# Patient Record
Sex: Female | Born: 1990 | Race: White | Hispanic: No | Marital: Single | Smoking: Former smoker
Health system: Southern US, Community
[De-identification: ages and names within clinical notes are randomized; demographics above are authoritative.]

## PROBLEM LIST (undated history)

## (undated) DIAGNOSIS — F329 Major depressive disorder, single episode, unspecified: Secondary | ICD-10-CM

## (undated) DIAGNOSIS — F32A Depression, unspecified: Secondary | ICD-10-CM

## (undated) DIAGNOSIS — F419 Anxiety disorder, unspecified: Secondary | ICD-10-CM

## (undated) DIAGNOSIS — R011 Cardiac murmur, unspecified: Secondary | ICD-10-CM

## (undated) HISTORY — PX: WISDOM TOOTH EXTRACTION: SHX21

## (undated) HISTORY — DX: Anxiety disorder, unspecified: F41.9

## (undated) HISTORY — PX: KNEE SURGERY: SHX244

---
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
## 2008-02-19 ENCOUNTER — Encounter: Payer: Self-pay | Admitting: Emergency Medicine

## 2008-02-19 ENCOUNTER — Observation Stay (HOSPITAL_COMMUNITY): Admission: AD | Admit: 2008-02-19 | Discharge: 2008-02-19 | Payer: Self-pay | Admitting: Obstetrics & Gynecology

## 2008-03-09 ENCOUNTER — Emergency Department (HOSPITAL_COMMUNITY): Admission: EM | Admit: 2008-03-09 | Discharge: 2008-03-09 | Payer: Self-pay | Admitting: Family Medicine

## 2008-03-15 HISTORY — PX: OTHER SURGICAL HISTORY: SHX169

## 2008-10-31 ENCOUNTER — Inpatient Hospital Stay (HOSPITAL_COMMUNITY): Admission: AD | Admit: 2008-10-31 | Discharge: 2008-11-03 | Payer: Self-pay | Admitting: Obstetrics and Gynecology

## 2010-06-20 LAB — CBC
HCT: 26.4 % — ABNORMAL LOW (ref 36.0–46.0)
HCT: 26.9 % — ABNORMAL LOW (ref 36.0–46.0)
Hemoglobin: 11.1 g/dL — ABNORMAL LOW (ref 12.0–15.0)
Hemoglobin: 8.7 g/dL — ABNORMAL LOW (ref 12.0–15.0)
Hemoglobin: 8.9 g/dL — ABNORMAL LOW (ref 12.0–15.0)
MCHC: 32.8 g/dL (ref 30.0–36.0)
MCHC: 32.8 g/dL (ref 30.0–36.0)
MCV: 84.8 fL (ref 78.0–100.0)
MCV: 85.1 fL (ref 78.0–100.0)
Platelets: 185 10*3/uL (ref 150–400)
Platelets: 203 10*3/uL (ref 150–400)
RBC: 3.97 MIL/uL (ref 3.87–5.11)
RDW: 16.9 % — ABNORMAL HIGH (ref 11.5–15.5)
RDW: 16.9 % — ABNORMAL HIGH (ref 11.5–15.5)
WBC: 18.7 10*3/uL — ABNORMAL HIGH (ref 4.0–10.5)
WBC: 26.8 10*3/uL — ABNORMAL HIGH (ref 4.0–10.5)

## 2010-06-20 LAB — CCBB MATERNAL DONOR DRAW

## 2010-07-28 NOTE — H&P (Signed)
NAMETamala Scott NO.:  [id number]   MEDICAL RECORD NO.:  [id number]          PATIENT TYPE:  MAT   LOCATION:  MATC                          [facility]  [facility]   PHYSICIAN:  Crist Fat. Rivard, M.D. DATE OF BIRTH:  Oct 29, 1990   DATE OF ADMISSION:  10/31/2008  DATE OF DISCHARGE:                              HISTORY & PHYSICAL   HISTORY:  [name] is a 20 year old gravida 1, para 0, at 40-2/7  weeks, who presented complaining of uterine contractions every 5-7  minutes for several hours.  She denied any leaking or bleeding and  reported positive fetal movement.  The cervix had been 3 cm in the  office on 08/17.  Pregnancy has been remarkable for  1. Late care at 15 weeks.  2. Smoker.  3. Previous marijuana use, which the patient stopped in the first      trimester.  4. Group-B strep negative.  5. First trimester bleeding.   PRENATAL LABS:  Blood type is A+, Rh antibody negative.  Urine  nonreactive, rubella titer positive, hepatitis-B surface antigen  negative, HIV was nonreactive.  Hemoglobin upon entering the practice  was 12.4.  The result was not noted on her prenatal record.  Her Glucola  was normal.  Quadruple screen was done and was negative.  Group-B strep  culture and other cultures were done at 36 weeks and were also negative.   HISTORY OF PRESENT PREGNANCY:  The patient entered care at approximately  15 weeks.  She had an ultrasound at that time and had a left corpus  luteum cyst noted.  Posterior placenta was noted and EDC of October 29, 2008, by ultrasound was noted to be in agreement with her LMP dating of  the same.  Smoking cessation was encouraged at that time.  She had some  first trimester bleeding.  This was not evaluated at the time of its  occurrence.  She had reported some marijuana use three times per day, up  to approximate one month prior to her new OB visit, so therefore it was  extending into the first trimester.  She had an  ultrasound done at 28  weeks with an estimated fetal weight within normal limits.  AFI was  normal.  There was a right ovarian cyst noted 9.5 cm x 8.6 cm.  The plan  was made to ultrasound again at 36 weeks and then consider repeating  this postpartum.  She had a UTI noted at 30 weeks and was treated with  Macrobid.  She had group-B strep and other cultures done at 36 weeks.  That is where her prenatal record ends at this time.   OBSTETRICAL HISTORY:  The patient is a primigravida.   MEDICAL HISTORY:  She is a previous marijuana user.  She stopped in  October 2009.  She reports the usual childhood illnesses.  The patient  used to have a heart murmur, but has had no problems since that time.   SURGICAL HISTORY:  1. Knee surgery at age 63.  2. Wisdom teeth removed at age 40.   FAMILY HISTORY:  Maternal  grandmother and maternal aunt x2 had chronic  hypertension.  Her maternal grandmother was diabetic, on insulin.  Her  maternal aunt had a stroke.  Paternal uncle also had a stroke.  Maternal  grandmother had lymphoma.  Maternal aunt and maternal cousin are  bipolar.  Everyone in the family smokes, per the patient's report.   GENETIC HISTORY:  Is unremarkable.   SOCIAL HISTORY:  The patient is single.  The father of the baby is  involved and supportive.  His name is Charlene Scott buyer.  The patient is  Caucasian.  She denies religious affiliation.  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  Her partner has an 11th grade  education.  He is employed as a Conservation officer, nature and is also working as a Nurse, children's-  Field seismologist.  The patient does report marijuana use in the early  first trimester.  She is a five to six-cigarette-per-day smoker.   ALLERGIES:  The patient has a sensitivity as a child to CODEINE.  No  other medication allergies are reported.   PHYSICAL EXAMINATION:  VITAL SIGNS:  Initial blood pressures were 125/90  and 127/88.  Follow-up blood pressures were more resolved in  the 120/70  range.  Other vital signs are stable.  HEENT: Within normal limits.  LUNGS:  Bilateral breath sounds are clear.  HEART:  Regular rate and rhythm, without murmur.  BREASTS:  Soft and nontender.  ABDOMEN:  Fundal height is appropriate for gestational age.  PELVIC EXAM:  Uterine contractions initially were every 5-7 minutes and  mild.  The patient did walk for an hour and then she was noted to have  contractions every 4 minutes, of more moderate quality.  Cervical exam  initially was 3 to 4, 100% vertex at minus one station by the R.N. exam  on the patient's arrival.  The cervix now is 5 to 6,  100% vertex at  minus one station, with bulging bag of water and positive bloody show.  Fetal heart rate was reactive on admission and remained so.  EXTREMITIES:  Within normal limits.   IMPRESSION:  1. Intrauterine pregnancy at 40-2/7 weeks.  2. Active labor.  3. Negative group-B Streptococcus.   PLAN:  1. Admit to birthing suite per consult with Dr. Silverio Lay, as      attending physician.  2. Routine physician orders.  3. The patient may desire IV pain medication or dural.  4. The MD's will follow.      Renaldo Reel Emilee Hero, C.N.M.      Crist Fat Rivard, M.D.  Electronically Signed    VLL/MEDQ  D:  10/31/2008  T:  10/31/2008  Job:  [id number]

## 2010-07-28 NOTE — Discharge Summary (Signed)
NAMEAudree Scott             ACCOUNT NO.:  [id number]   MEDICAL RECORD NO.:  [id number]          PATIENT TYPE:  INP   LOCATION:  9308                          [facility]  [facility]   PHYSICIAN:  Osborn Coho, M.D.   DATE OF BIRTH:  Feb 02, 1991   DATE OF ADMISSION:  10/31/2008  DATE OF DISCHARGE:  11/03/2008                               DISCHARGE SUMMARY   ADMITTING DIAGNOSES:  1. Intrauterine pregnancy at 40 and 2/7 weeks.  2. Active labor.  3. GBS negative.   DISCHARGE DIAGNOSES:  1. Intrauterine pregnancy at 40 and 2/7 weeks.  2. Active labor.  3. GBS negative.  4. Bottle feeding.  5. Anemia without hemodynamic instability.  6. Status post vacuum extraction with findings of a viable female      infant born on November 01, 2010 at 0214 at 40 and 3/7 weeks, left      occipitoanterior position and female weight 8 pounds 1 ounce (3665      g), length 21 inches, and Apgars 7 at 1 minute and 9 at 5 minutes.   HOSPITAL PROCEDURES:  1. Epidural anesthesia.  2. Vacuum-assisted vaginal delivery secondary to maternal exhaustion.   HOSPITAL COURSE:  [name] is a 20 year old gravida 1, para 0, who  presented on date of admission at 40-2/7 weeks, complaining of uterine  contractions every 5-7 minutes for several hours, denied any leakage of  fluid or vaginal bleeding, but a positive fetal movement.  Cervix had  been 3 cm in the office on August 17.  Pregnancy had been remarkable  for:  1. Late to care at 15 weeks.  2. Smoker.  3. Previous marijuana use.  The patient stopped after first trimester.  4. GBS negative.  5. First trimester vaginal bleeding.   On presentation, she was afebrile.  Initial blood pressure was 125/93,  repeat was 127/88, and all blood pressures, following that were within  normal limits.  Her contractions were initially every 5-7 minutes and  were mild.  After walking for 1 hour, contractions were 4 minutes and  moderate quality.  Initial cervical exam was  3-4 cm, 100% effaced, and  vertex -1 station.  Following ambulation, cervix was 5-6 cm, 100% and -1  with bulging membranes and positive bloody show.  Fetal heart rate was  reactive.  She was admitted to birthing suite per consult with Dr.  Dois Davenport Rivard with routine L and D orders.  Around 2:30 in the afternoon  on the 19th, the patient was resting and doing better after having IV  Stadol.  Fetal heart rate was reassuring.  Contractions were still  around every 4 minutes.  Vaginal exam was unchanged 5-6 cm, 100% and -1.  Artificial rupture of membranes was performed by Dr. Dois Davenport Rivard and  clear fluid was noted.  Dr. Estanislado Pandy did talk to the patient about getting  epidural at that time, at 1730 p.m., she returned, the patient did  receive epidural, and was comfortable.  Fetal heart rate was overall  reassuring.  Vaginal exam was still 5 cm, 100% with some edema to  anterior lip, ROP  presentation, an IUPC was inserted, and plan was made  to begin Pitocin if induce inadequate.  At 23:30 on the 19th, Pitocin  was at 14 milliunits per minute.  Contractions were every 1-1/2 to 2  minutes and these were greater than equal to 200.  Fetal heart rate was  reactive, excellent variability and accelerations.  She had an anterior  lip on her vaginal exam, ROA position was noted, +1 to +2 station.  The  patient was fully dilated at around 12:30 a.m. on the 20th and was  pushing with her contractions and pushing well.  Fetal heart rate was  reassuring.  After a little over 2 hours of pushing, the patient had  pushed to crowning and Dr. Estanislado Pandy did apply outlet vacuum due to  maternal exhaustion.  It was applied easily, crowning in ROA position,  traction over to contractions, and a vacuum-assisted vaginal delivery  was noted at 2:14 a.m. and findings were a viable female infant named  Charlene Scott, she weighed 8 pounds 1 ounce, Apgars were 7 at 1 minute and 9  at 5 minutes.  Second-degree laceration was  noted and repaired with a 3-  0 and 4-0 Monopril, spontaneously expelled placenta with three-vessel  cord, and was given for cord blood donation.  The patient did receive 3  g of IV and Unasyn 30 minutes before delivery for temperature of 100.7  with some fetal tachycardia in 170-180s.  EBL was approximately 500 mL.  She did require 1000 of Cytotec per rectum and tolerated well and was  stable on L and D recovery.  She was transferred to the third floor for  her postpartum stay.  On postpartum day #1, which is a 21st, she was  doing well.  She was ambulating, voiding and tolerating p.o. liquids and  solids without difficulty.  Denied any dizziness or weakness.  She was  bottle feeding and pain was well controlled.  She remained afebrile and  vital signs were stable.  Blood pressure range from 95-112 systolic and  diastolics were 16-10.  Her physical exam was within normal limits.  Abdomen was soft and nontender.  Positive bowel sounds.  Fundus was firm  and 2 fingerbreadths below umbilicus.  She had small rubra lochia and  perineum was healing.  No lower extremity edema and negative Homans.  CBC, white count was up to 18.7 from 17.7, hemoglobin was down to 8.7  from 11.1 and her platelets were stable at 203.  Routine postpartum care  continued and on November 03, 2008, postpartum day #2, she was doing well  and was ready for discharge.  Continued to deny any dizziness or  syncope.  She is undecided on contraception.  She is still bottle  feeding.  She reports being sore mainly with ambulation and sitting.  As  far as perineal pain, she did desire additional pain medication with a  Motrin.  She is voiding and up without difficulty and tolerating diet.  She remains afebrile and vital signs stable.  Physical exam is within  normal limits.  Fundus is firm, below umbilicus.  She had small rubra  lochia.  Perineum is healing and extremities are still without edema and  negative Homans.    DISCHARGE INSTRUCTIONS:  Per [facility] pamphlet.  Warning signs and symptoms  report were reviewed.   DISCHARGE FOLLOWUP:  To occur at [facility] and 4-6 weeks  or as needed.   DISCHARGE MEDICATIONS:  1. Motrin 600 mg p.o. q.6  h. p.r.n. pain.  2. Vicodin 1 tablet p.o. q.3 h. p.r.n. moderate-to-severe pain or 2      tablets p.o. q.6 h. p.r.n. moderate-to-severe pain.  3. Prefera-OB.  4. Prenatal vitamin 1 tablet p.o. day.  5. Did offer the patient Depo-Provera shot today before discharge, but      the patient declines and is to decide at her postpartum appointment      regarding future contraception.     Candice Lucas, [place]      Osborn Coho, M.D.  Electronically Signed   CHS/MEDQ  D:  11/03/2008  T:  11/03/2008  Job:  [id number]

## 2010-12-10 LAB — POCT URINALYSIS DIP (DEVICE)
Bilirubin Urine: NEGATIVE
Glucose, UA: NEGATIVE
Ketones, ur: NEGATIVE
Specific Gravity, Urine: 1.02

## 2010-12-10 LAB — URINE CULTURE
Colony Count: NO GROWTH
Culture: NO GROWTH

## 2010-12-11 LAB — GC/CHLAMYDIA PROBE AMP, GENITAL
Chlamydia, DNA Probe: NEGATIVE
GC Probe Amp, Genital: NEGATIVE

## 2010-12-11 LAB — POCT URINALYSIS DIP (DEVICE)
Nitrite: NEGATIVE
Urobilinogen, UA: 1
pH: 7.5

## 2010-12-11 LAB — URINE CULTURE: Colony Count: 9000

## 2010-12-11 LAB — POCT PREGNANCY, URINE: Preg Test, Ur: NEGATIVE

## 2010-12-18 LAB — URINE MICROSCOPIC-ADD ON

## 2010-12-18 LAB — WET PREP, GENITAL
Trich, Wet Prep: NONE SEEN
Yeast Wet Prep HPF POC: NONE SEEN

## 2010-12-18 LAB — POCT URINALYSIS DIP (DEVICE)
Hgb urine dipstick: NEGATIVE
Nitrite: NEGATIVE
Specific Gravity, Urine: 1.015 (ref 1.005–1.030)
pH: 7 (ref 5.0–8.0)

## 2010-12-18 LAB — CBC
Hemoglobin: 12.6 g/dL (ref 12.0–15.0)
MCHC: 33.1 g/dL (ref 30.0–36.0)
MCHC: 35.1 g/dL (ref 30.0–36.0)
MCV: 93.3 fL (ref 78.0–100.0)
Platelets: 190 10*3/uL (ref 150–400)
RBC: 3.16 MIL/uL — ABNORMAL LOW (ref 3.87–5.11)
RDW: 14.1 % (ref 11.5–15.5)
RDW: 14.4 % (ref 11.5–15.5)

## 2010-12-18 LAB — URINALYSIS, ROUTINE W REFLEX MICROSCOPIC
Glucose, UA: NEGATIVE mg/dL
Hgb urine dipstick: NEGATIVE
Protein, ur: NEGATIVE mg/dL
pH: 6 (ref 5.0–8.0)

## 2010-12-18 LAB — DIFFERENTIAL
Basophils Absolute: 0 10*3/uL (ref 0.0–0.1)
Basophils Absolute: 0 10*3/uL (ref 0.0–0.1)
Basophils Relative: 0 % (ref 0–1)
Basophils Relative: 0 % (ref 0–1)
Eosinophils Absolute: 0.2 10*3/uL (ref 0.0–0.7)
Lymphocytes Relative: 12 % (ref 12–46)
Lymphocytes Relative: 28 % (ref 12–46)
Monocytes Absolute: 0.8 10*3/uL (ref 0.1–1.0)
Neutro Abs: 11.6 10*3/uL — ABNORMAL HIGH (ref 1.7–7.7)
Neutro Abs: 4.6 10*3/uL (ref 1.7–7.7)
Neutrophils Relative %: 60 % (ref 43–77)
Neutrophils Relative %: 83 % — ABNORMAL HIGH (ref 43–77)

## 2010-12-18 LAB — GC/CHLAMYDIA PROBE AMP, GENITAL: GC Probe Amp, Genital: NEGATIVE

## 2010-12-18 LAB — POCT I-STAT, CHEM 8
BUN: 20 mg/dL (ref 6–23)
Calcium, Ion: 1.18 mmol/L (ref 1.12–1.32)
Chloride: 108 mEq/L (ref 96–112)
HCT: 39 % (ref 36.0–46.0)
Potassium: 3.7 mEq/L (ref 3.5–5.1)
Sodium: 140 mEq/L (ref 135–145)

## 2011-04-21 ENCOUNTER — Encounter (HOSPITAL_COMMUNITY): Payer: Self-pay | Admitting: Pharmacist

## 2011-04-21 ENCOUNTER — Other Ambulatory Visit: Payer: Self-pay | Admitting: Obstetrics and Gynecology

## 2011-04-24 NOTE — H&P (Signed)
NAMETamala Scott NO.:  [id number]  MEDICAL RECORD NO.:  [id number]  LOCATION:  PERIO                         [facility]  [facility]  PHYSICIAN:  Crist Fat. Rivard, M.D. DATE OF BIRTH:  02/25/1991  DATE OF ADMISSION:  04/20/2011 DATE OF DISCHARGE:                             HISTORY & PHYSICAL   HISTORY OF PRESENT ILLNESS:  Charlene Scott is a 21 year old single white female, para 1-0-0-1, presenting for a robot-assisted laparoscopic ovarian cystectomy, because of a persistent enlarging left ovarian cyst. In August 2010, the patient had a vacuum-assisted vaginal birth and was Noted, during her antepartum period, to have a 9-cm right "corpus luteum" ovarian cyst.  Following the patient's delivery, a followup ultrasound noted that the cyst was actually on the left, was simple, and measured at that time 8.45 cm x 4.98 cm x 7.50 cm.  The patient was essentially asymptomatic except for occasional pelvic pressure and positional dyspareunia.  In January 2013, however, Charlene Scott complained of increasing pelvic pressure with pain and worsening dyspareunia.  She denied any vaginitis symptoms, urinary tract symptoms, changes in her bowel habits or fever.  The patient had a repeat pelvic ultrasound in February 2013 that showed a complex left ovarian cyst, measuring 9.87 x 9.56 x 7.4 cm without any increased color Doppler flow.  The patient's intrauterine device was properly seated within the endometrium by 3-D rendering, her right ovary measured 2.88 x 3.33 x 2.30 cm, and her uterus measured 6.95 x 3.76 x 3.47 cm.  The patient had normal labs that included a comprehensive metabolic panel, complete blood count, LBH, and OVA-1 and a negative beta-HCG.  Given that patient's cyst has been persistent, enlarging and becoming increasingly symptomatic, she has decided to proceed with laparoscopic removal of her left ovarian cyst.  PAST MEDICAL HISTORY:  OBSTETRICAL HISTORY:  Gravida 1,  para 1-0-0-1.  The patient had a vacuum- assisted vaginal birth in August 2010.  GYNECOLOGICAL HISTORY:  Menarche, 21 years old.  The patient does not have a period due to the fact she uses Mirena intrauterine device as her method of contraception.  She denies any history of abnormal Pap smears or sexually transmitted diseases.  Her last Pap was in January 2013.  MEDICAL HISTORY:  Negative.  SURGICAL HISTORY:  2006, right knee surgery.  FAMILY HISTORY:  Positive for asthma, leukemia, Hodgkin lymphoma, hypertension, blood clots, anemia, diabetes mellitus, migraines, stroke, bipolar disorder, depression, and thyroid disease.  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  HABITS:  She uses tobacco daily.  Denies any alcohol or illicit drug use.  CURRENT MEDICATIONS:  None.  ALLERGIES:  She is allergic to CODEINE that causes a rash.  REVIEW OF SYSTEMS:  The patient denies any headache, vision changes, chest pain, shortness of breath, dysuria, hematuria, urinary frequency, urgency, nausea, vomiting, diarrhea, myalgias, arthralgias, and except as is mentioned in history of present illness, the patient's review of systems is otherwise negative.  PHYSICAL EXAMINATION:  VITAL SIGNS:  Blood pressure 104/72, temperature is 98.2 degrees Fahrenheit orally, weight is 171 pounds, height is 5 feet 6.5 inches tall.  Body  mass index is 27.2. HEENT:  Pupils are equal.  Hearing is normal.  Throat is clear. NECK:  Supple without masses.  There is no thyromegaly or cervical adenopathy. HEART:  Regular rate and rhythm. LUNGS:  Clear. BACK:  No CVA tenderness. ABDOMEN:  No tenderness, guarding, rebound, palpable masses, or organomegaly. EXTREMITIES:  No clubbing, cyanosis, or edema. PELVIC:  EGBUS was normal.  Vagina was normal.  Cervix was nontender without lesions.  There were visible IUD strings coming from the cervical  os.  Uterus appeared normal size, shape, and consistency, however, there was a palpable tender mass on the posterior aspect of the uterus.  Adnexa, no palpable masses, however, there was tenderness in the left adnexa.  IMPRESSION: 1. Large left ovarian cyst. 2. Pelvic pain. 3. Dyspareunia.  DISPOSITION:  A discussion was held with the patient regarding indications for her procedure along with its risks, which include, but are not limited to, reaction to anesthesia, damage to adjacent organs, infection, and excessive bleeding.  The patient verbalized understanding of these risks along with the possibility that an oophorectomy may be required and has consented to proceed with a robot-assisted laparoscopic left ovarian cystectomy with pelvic washings and frozen section at [facility] on April 28, 2011 at 2:00 p.m.     Vitali Seibert J. Lowell Guitar, P.A.-C   ______________________________ Crist Fat Rivard, M.D.    EJP/MEDQ  D:  04/23/2011  T:  04/24/2011  Job:  [id number]

## 2011-04-27 ENCOUNTER — Encounter (HOSPITAL_COMMUNITY)
Admission: RE | Admit: 2011-04-27 | Discharge: 2011-04-27 | Disposition: A | Discharge status: Home / Self Care | Payer: BC Managed Care – PPO | Source: Ambulatory Visit | Attending: Obstetrics and Gynecology | Admitting: Obstetrics and Gynecology

## 2011-04-27 ENCOUNTER — Encounter (HOSPITAL_COMMUNITY): Payer: Self-pay

## 2011-04-27 HISTORY — DX: Cardiac murmur, unspecified: R01.1

## 2011-04-27 HISTORY — DX: Depression, unspecified: F32.A

## 2011-04-27 HISTORY — DX: Major depressive disorder, single episode, unspecified: F32.9

## 2011-04-27 NOTE — Pre-Procedure Instructions (Signed)
Ok to see patient DOS. 

## 2011-04-27 NOTE — Patient Instructions (Addendum)
   Your procedure is scheduled on: Wednesday, Feb 13th  Enter through the [facility] at: 12:45pm Pick up the phone at the desk and dial [phone] and inform us of your arrival.  Please call this number if you have any problems the morning of surgery: [phone]  Remember: Do not eat food after midnight: Tuesday Do not drink clear liquids after: 10am Wednesday Take these medicines the morning of surgery with a SIP OF WATER: none  Do not wear jewelry, make-up, or FINGER nail polish Do not wear lotions, powders, perfumes or deodorant. Do not shave 48 hours prior to surgery. Do not bring valuables to the hospital.  Patients discharged on the day of surgery will not be allowed to drive home.   Home with Parents Mr and Ms. Germann  cell [phone].   Remember to use your hibiclens as instructed.Please shower with 1/2 bottle the evening before your surgery and the other 1/2 bottle the morning of surgery.

## 2011-04-28 ENCOUNTER — Ambulatory Visit (HOSPITAL_COMMUNITY)
Admission: RE | Admit: 2011-04-28 | Discharge: 2011-04-28 | Disposition: A | Discharge status: Home / Self Care | Payer: BC Managed Care – PPO | Source: Ambulatory Visit | Attending: Obstetrics and Gynecology | Admitting: Obstetrics and Gynecology

## 2011-04-28 ENCOUNTER — Encounter (HOSPITAL_COMMUNITY): Payer: Self-pay | Admitting: *Deleted

## 2011-04-28 ENCOUNTER — Encounter (HOSPITAL_COMMUNITY): Payer: Self-pay | Admitting: Certified Registered"

## 2011-04-28 ENCOUNTER — Other Ambulatory Visit: Payer: Self-pay | Admitting: Obstetrics and Gynecology

## 2011-04-28 ENCOUNTER — Encounter (HOSPITAL_COMMUNITY)
Admission: RE | Disposition: A | Discharge status: Home / Self Care | Payer: Self-pay | Source: Ambulatory Visit | Attending: Obstetrics and Gynecology

## 2011-04-28 ENCOUNTER — Ambulatory Visit (HOSPITAL_COMMUNITY): Payer: BC Managed Care – PPO | Admitting: Certified Registered"

## 2011-04-28 DIAGNOSIS — D279 Benign neoplasm of unspecified ovary: Secondary | ICD-10-CM | POA: Insufficient documentation

## 2011-04-28 DIAGNOSIS — Z975 Presence of (intrauterine) contraceptive device: Secondary | ICD-10-CM | POA: Insufficient documentation

## 2011-04-28 DIAGNOSIS — N83209 Unspecified ovarian cyst, unspecified side: Secondary | ICD-10-CM

## 2011-04-28 LAB — PREGNANCY, URINE: Preg Test, Ur: NEGATIVE

## 2011-04-28 SURGERY — ROBOTIC ASSISTED LAPAROSCOPIC OVARIAN CYSTECTOMY
Anesthesia: General | Site: Abdomen | Laterality: Left | Wound class: Clean Contaminated

## 2011-04-28 MED ORDER — ROCURONIUM BROMIDE 50 MG/5ML IV SOLN
INTRAVENOUS | Status: AC
  Filled 2011-04-28: qty 1

## 2011-04-28 MED ORDER — PROMETHAZINE HCL 25 MG/ML IJ SOLN: 6.2500 mg | INTRAMUSCULAR | Status: DC | PRN

## 2011-04-28 MED ORDER — BUPIVACAINE HCL (PF) 0.25 % IJ SOLN
INTRAMUSCULAR | Status: DC | PRN
  Administered 2011-04-28: 10 mL

## 2011-04-28 MED ORDER — CEFAZOLIN SODIUM 1-5 GM-% IV SOLN
1.0000 g | INTRAVENOUS | Status: AC
  Administered 2011-04-28: 1 g via INTRAVENOUS

## 2011-04-28 MED ORDER — NEOSTIGMINE METHYLSULFATE 1 MG/ML IJ SOLN
INTRAMUSCULAR | Status: DC | PRN
  Administered 2011-04-28: 3 mg via INTRAVENOUS

## 2011-04-28 MED ORDER — ARTIFICIAL TEARS OP OINT
TOPICAL_OINTMENT | OPHTHALMIC | Status: DC | PRN
  Administered 2011-04-28: 1 via OPHTHALMIC

## 2011-04-28 MED ORDER — HYDROCODONE-ACETAMINOPHEN 5-325 MG PO TABS
1.0000 | ORAL_TABLET | Freq: Once | ORAL | Status: AC
  Administered 2011-04-28: 1 via ORAL

## 2011-04-28 MED ORDER — HYDROCODONE-ACETAMINOPHEN 5-325 MG PO TABS
ORAL_TABLET | ORAL | Status: AC
  Administered 2011-04-28: 1 via ORAL
  Filled 2011-04-28: qty 1

## 2011-04-28 MED ORDER — CEFAZOLIN SODIUM 1-5 GM-% IV SOLN
INTRAVENOUS | Status: AC
  Filled 2011-04-28: qty 50

## 2011-04-28 MED ORDER — BUPIVACAINE HCL (PF) 0.25 % IJ SOLN
INTRAMUSCULAR | Status: AC
  Filled 2011-04-28: qty 30

## 2011-04-28 MED ORDER — GLYCOPYRROLATE 0.2 MG/ML IJ SOLN
INTRAMUSCULAR | Status: AC
Start: 2011-04-28 — End: 2011-04-28
  Filled 2011-04-28: qty 2

## 2011-04-28 MED ORDER — IBUPROFEN 600 MG PO TABS: 600.0000 mg | ORAL_TABLET | Freq: Four times a day (QID) | ORAL | Status: AC | PRN

## 2011-04-28 MED ORDER — LACTATED RINGERS IR SOLN
Status: DC | PRN
  Administered 2011-04-28: 3000 mL

## 2011-04-28 MED ORDER — PROPOFOL 10 MG/ML IV EMUL
INTRAVENOUS | Status: AC
  Filled 2011-04-28: qty 40

## 2011-04-28 MED ORDER — MIDAZOLAM HCL 2 MG/2ML IJ SOLN
INTRAMUSCULAR | Status: AC
  Filled 2011-04-28: qty 2

## 2011-04-28 MED ORDER — ONDANSETRON HCL 4 MG/2ML IJ SOLN
INTRAMUSCULAR | Status: DC | PRN
  Administered 2011-04-28: 4 mg via INTRAVENOUS

## 2011-04-28 MED ORDER — ONDANSETRON HCL 4 MG/2ML IJ SOLN
INTRAMUSCULAR | Status: AC
  Filled 2011-04-28: qty 2

## 2011-04-28 MED ORDER — SILVER NITRATE-POT NITRATE 75-25 % EX MISC
CUTANEOUS | Status: AC
  Filled 2011-04-28: qty 1

## 2011-04-28 MED ORDER — SILVER NITRATE-POT NITRATE 75-25 % EX MISC
CUTANEOUS | Status: DC | PRN
  Administered 2011-04-28: 1

## 2011-04-28 MED ORDER — SUFENTANIL CITRATE 50 MCG/ML IV SOLN
INTRAVENOUS | Status: AC
  Filled 2011-04-28: qty 1

## 2011-04-28 MED ORDER — NEOSTIGMINE METHYLSULFATE 1 MG/ML IJ SOLN
INTRAMUSCULAR | Status: AC
  Filled 2011-04-28: qty 10

## 2011-04-28 MED ORDER — PROPOFOL 10 MG/ML IV EMUL
INTRAVENOUS | Status: DC | PRN
  Administered 2011-04-28: 20 mg via INTRAVENOUS
  Administered 2011-04-28: 160 mg via INTRAVENOUS

## 2011-04-28 MED ORDER — SUFENTANIL CITRATE 50 MCG/ML IV SOLN
INTRAVENOUS | Status: DC | PRN
  Administered 2011-04-28: 20 ug via INTRAVENOUS
  Administered 2011-04-28: 10 ug via INTRAVENOUS
  Administered 2011-04-28: 20 ug via INTRAVENOUS

## 2011-04-28 MED ORDER — MEPERIDINE HCL 25 MG/ML IJ SOLN: 6.2500 mg | INTRAMUSCULAR | Status: DC | PRN

## 2011-04-28 MED ORDER — LIDOCAINE HCL (CARDIAC) 20 MG/ML IV SOLN
INTRAVENOUS | Status: AC
Start: 2011-04-28 — End: 2011-04-28
  Filled 2011-04-28: qty 5

## 2011-04-28 MED ORDER — FENTANYL CITRATE 0.05 MG/ML IJ SOLN: 25.0000 ug | INTRAMUSCULAR | Status: DC | PRN

## 2011-04-28 MED ORDER — GLYCOPYRROLATE 0.2 MG/ML IJ SOLN
INTRAMUSCULAR | Status: DC | PRN
  Administered 2011-04-28: .6 mg via INTRAVENOUS

## 2011-04-28 MED ORDER — LACTATED RINGERS IV SOLN
INTRAVENOUS | Status: DC
  Administered 2011-04-28 (×3): via INTRAVENOUS
  Administered 2011-04-28: 50 mL/h via INTRAVENOUS

## 2011-04-28 MED ORDER — MIDAZOLAM HCL 5 MG/5ML IJ SOLN
INTRAMUSCULAR | Status: DC | PRN
  Administered 2011-04-28: 2 mg via INTRAVENOUS

## 2011-04-28 MED ORDER — ROCURONIUM BROMIDE 100 MG/10ML IV SOLN
INTRAVENOUS | Status: DC | PRN
  Administered 2011-04-28: 20 mg via INTRAVENOUS
  Administered 2011-04-28: 50 mg via INTRAVENOUS

## 2011-04-28 MED ORDER — HYDROCODONE-ACETAMINOPHEN 5-500 MG PO TABS: 1.0000 | ORAL_TABLET | Freq: Four times a day (QID) | ORAL | Status: AC | PRN

## 2011-04-28 MED ORDER — LIDOCAINE HCL (CARDIAC) 20 MG/ML IV SOLN
INTRAVENOUS | Status: DC | PRN
  Administered 2011-04-28: 50 mg via INTRAVENOUS

## 2011-04-28 MED ORDER — DEXAMETHASONE SODIUM PHOSPHATE 10 MG/ML IJ SOLN
INTRAMUSCULAR | Status: AC
  Filled 2011-04-28: qty 1

## 2011-04-28 MED ORDER — KETOROLAC TROMETHAMINE 30 MG/ML IJ SOLN: 15.0000 mg | Freq: Once | INTRAMUSCULAR | Status: DC | PRN

## 2011-04-28 MED ORDER — DEXAMETHASONE SODIUM PHOSPHATE 10 MG/ML IJ SOLN
INTRAMUSCULAR | Status: DC | PRN
  Administered 2011-04-28: 10 mg via INTRAVENOUS

## 2011-04-28 SURGICAL SUPPLY — 69 items
10181 ×2 IMPLANT
BAG URINE DRAINAGE (UROLOGICAL SUPPLIES) ×2 IMPLANT
BARRIER ADHS 3X4 INTERCEED (GAUZE/BANDAGES/DRESSINGS) ×2 IMPLANT
BENZOIN TINCTURE PRP APPL 2/3 (GAUZE/BANDAGES/DRESSINGS) ×2 IMPLANT
CABLE HIGH FREQUENCY MONO STRZ (ELECTRODE) ×2 IMPLANT
CATH FOLEY 3WAY  5CC 16FR (CATHETERS) ×1
CATH FOLEY 3WAY  5CC 18FR (CATHETERS)
CATH FOLEY 3WAY 5CC 16FR (CATHETERS) ×1 IMPLANT
CATH FOLEY 3WAY 5CC 18FR (CATHETERS) IMPLANT
CHLORAPREP W/TINT 26ML (MISCELLANEOUS) ×2 IMPLANT
CLOTH BEACON ORANGE TIMEOUT ST (SAFETY) ×2 IMPLANT
CONT PATH 16OZ SNAP LID 3702 (MISCELLANEOUS) ×2 IMPLANT
CORDS BIPOLAR (ELECTRODE) IMPLANT
COVER MAYO STAND STRL (DRAPES) ×2 IMPLANT
COVER TABLE BACK 60X90 (DRAPES) ×4 IMPLANT
COVER TIP SHEARS 8 DVNC (MISCELLANEOUS) ×1 IMPLANT
COVER TIP SHEARS 8MM DA VINCI (MISCELLANEOUS) ×1
DECANTER SPIKE VIAL GLASS SM (MISCELLANEOUS) ×2 IMPLANT
DERMABOND ADVANCED (GAUZE/BANDAGES/DRESSINGS) ×1
DERMABOND ADVANCED .7 DNX12 (GAUZE/BANDAGES/DRESSINGS) ×1 IMPLANT
DRAPE HUG U DISPOSABLE (DRAPE) ×2 IMPLANT
DRAPE LG THREE QUARTER DISP (DRAPES) ×4 IMPLANT
DRAPE MONITOR DA VINCI (DRAPE) IMPLANT
DRAPE WARM FLUID 44X44 (DRAPE) ×2 IMPLANT
ELECT REM PT RETURN 9FT ADLT (ELECTROSURGICAL) ×2
ELECTRODE REM PT RTRN 9FT ADLT (ELECTROSURGICAL) ×1 IMPLANT
EVACUATOR SMOKE 8.L (FILTER) ×2 IMPLANT
GAUZE VASELINE 3X9 (GAUZE/BANDAGES/DRESSINGS) IMPLANT
GLOVE BIOGEL PI IND STRL 7.0 (GLOVE) ×3 IMPLANT
GLOVE BIOGEL PI INDICATOR 7.0 (GLOVE) ×3
GLOVE ECLIPSE 6.5 STRL STRAW (GLOVE) ×6 IMPLANT
GOWN STRL REIN XL XLG (GOWN DISPOSABLE) ×12 IMPLANT
GRASPER BIPOLAR FEN DA VINCI (INSTRUMENTS)
GRASPER BPLR FEN DVNC (INSTRUMENTS) IMPLANT
KIT ACCESSORY DA VINCI DISP (KITS) ×1
KIT ACCESSORY DVNC DISP (KITS) ×1 IMPLANT
KIT DISP ACCESSORY 4 ARM (KITS) IMPLANT
NS IRRIG 1000ML POUR BTL (IV SOLUTION) ×6 IMPLANT
OCCLUDER COLPOPNEUMO (BALLOONS) IMPLANT
PACK LAVH (CUSTOM PROCEDURE TRAY) ×2 IMPLANT
PAD PREP 24X48 CUFFED NSTRL (MISCELLANEOUS) ×4 IMPLANT
PLUG CATH AND CAP STER (CATHETERS) ×2 IMPLANT
POSITIONER SURGICAL ARM (MISCELLANEOUS) ×4 IMPLANT
PROTECTOR NERVE ULNAR (MISCELLANEOUS) ×4 IMPLANT
SET CYSTO W/LG BORE CLAMP LF (SET/KITS/TRAYS/PACK) IMPLANT
SET IRRIG TUBING LAPAROSCOPIC (IRRIGATION / IRRIGATOR) ×2 IMPLANT
SOLUTION ELECTROLUBE (MISCELLANEOUS) ×2 IMPLANT
SPONGE LAP 18X18 X RAY DECT (DISPOSABLE) IMPLANT
STRIP CLOSURE SKIN 1/2X4 (GAUZE/BANDAGES/DRESSINGS) ×2 IMPLANT
SUT MNCRL AB 3-0 PS2 27 (SUTURE) ×4 IMPLANT
SUT VIC AB 0 CT1 27 (SUTURE)
SUT VIC AB 0 CT1 27XBRD ANTBC (SUTURE) IMPLANT
SUT VIC AB 0 CT2 27 (SUTURE) IMPLANT
SUT VIC AB 2-0 CT2 27 (SUTURE) ×2 IMPLANT
SUT VICRYL 0 UR6 27IN ABS (SUTURE) ×4 IMPLANT
SYR 50ML LL SCALE MARK (SYRINGE) ×2 IMPLANT
SYSTEM CONVERTIBLE TROCAR (TROCAR) ×2 IMPLANT
TIP UTERINE 5.1X6CM LAV DISP (MISCELLANEOUS) IMPLANT
TIP UTERINE 6.7X10CM GRN DISP (MISCELLANEOUS) IMPLANT
TIP UTERINE 6.7X6CM WHT DISP (MISCELLANEOUS) IMPLANT
TIP UTERINE 6.7X8CM BLUE DISP (MISCELLANEOUS) ×2 IMPLANT
TOWEL OR 17X24 6PK STRL BLUE (TOWEL DISPOSABLE) ×6 IMPLANT
TROCAR 12M 150ML BLUNT (TROCAR) ×2 IMPLANT
TROCAR DISP BLADELESS 8 DVNC (TROCAR) ×1 IMPLANT
TROCAR DISP BLADELESS 8MM (TROCAR) ×1
TROCAR XCEL 12X100 BLDLESS (ENDOMECHANICALS) ×2 IMPLANT
TROCAR Z-THREAD BLADED 12X100M (TROCAR) IMPLANT
TUBING FILTER THERMOFLATOR (ELECTROSURGICAL) ×2 IMPLANT
WARMER LAPAROSCOPE (MISCELLANEOUS) ×2 IMPLANT

## 2011-04-28 NOTE — Discharge Instructions (Signed)
POST-OPERATIVE INSTRUCTIONS TO PATIENT  Call [facility]  [phone])  for excessive pain, bleeding or temperature greater than or equal to 100.4 degrees (orally).    No driving for 1day No lifting (more than 20 lbs) for 1 week No sexual activity for 2 weeks  Pain management:  Use Ibuprofen 600 mg every 6 hours for 5 days and then as needed. Use your pain medication as needed to maintain a pain level at or below 3/10 Use Colace 1-2 capsules per day as long as you are using pain  medication to avoid constipation.       Diet: normal  Bathing: may shower day after surgery  Wound Care: keep incisions clean and dry  Return to Dr. Estanislado Pandy on 05/19/11 at 8:30 am  Return to work: 1 week    Domonick Sittner A MD 04/28/2011 2:24 PM

## 2011-04-28 NOTE — Transfer of Care (Signed)
Immediate Anesthesia Transfer of Care Note  Patient: Charlene Scott  Procedure(s) Performed: Procedure(s) (LRB): ROBOTIC ASSISTED LAPAROSCOPIC OVARIAN CYSTECTOMY (Left)  Patient Location: PACU  Anesthesia Type: General  Level of Consciousness: alert  and oriented  Airway & Oxygen Therapy: Patient Spontanous Breathing and Patient connected to nasal cannula oxygen  Post-op Assessment: Report given to PACU RN and Post -op Vital signs reviewed and stable  Post vital signs: stable  Complications: No apparent anesthesia complications

## 2011-04-28 NOTE — Anesthesia Preprocedure Evaluation (Signed)
Anesthesia Evaluation  Patient identified by MRN, date of birth, ID band Patient awake    Reviewed: Allergy & Precautions, H&P , NPO status , Patient's Chart, lab work & pertinent test results  Airway Mallampati: I TM Distance: >3 FB Neck ROM: full    Dental No notable dental hx. (+) Teeth Intact   Pulmonary  clear to auscultation  Pulmonary exam normal       Cardiovascular neg cardio ROS     Neuro/Psych Depression Negative Neurological ROS     GI/Hepatic negative GI ROS, Neg liver ROS,   Endo/Other  Negative Endocrine ROS  Renal/GU negative Renal ROS  Genitourinary negative   Musculoskeletal negative musculoskeletal ROS (+)   Abdominal Normal abdominal exam  (+)   Peds negative pediatric ROS (+)  Hematology negative hematology ROS (+)   Anesthesia Other Findings   Reproductive/Obstetrics negative OB ROS                           Anesthesia Physical Anesthesia Plan  ASA: II  Anesthesia Plan: General   Post-op Pain Management:    Induction: Intravenous  Airway Management Planned: Oral ETT  Additional Equipment:   Intra-op Plan:   Post-operative Plan: Extubation in OR  Informed Consent: I have reviewed the patients History and Physical, chart, labs and discussed the procedure including the risks, benefits and alternatives for the proposed anesthesia with the patient or authorized representative who has indicated his/her understanding and acceptance.   Dental Advisory Given  Plan Discussed with: CRNA  Anesthesia Plan Comments:         Anesthesia Quick Evaluation

## 2011-04-28 NOTE — Anesthesia Postprocedure Evaluation (Signed)
Anesthesia Post Note  Patient: Charlene Scott  Procedure(s) Performed: Procedure(s) (LRB): ROBOTIC ASSISTED LAPAROSCOPIC OVARIAN CYSTECTOMY (Left)  Anesthesia type: General  Patient location: PACU  Post pain: Pain level controlled  Post assessment: Post-op Vital signs reviewed  Last Vitals:  Filed Vitals:   04/28/11 1700  BP: 92/50  Pulse: 73  Temp: 36.8 C  Resp: 16    Post vital signs: Reviewed  Level of consciousness: sedated  Complications: No apparent anesthesia complicationsfj

## 2011-04-28 NOTE — Op Note (Signed)
Preoperative diagnosis; left ovarian cyst  Postoperative diagnosis: Same  Anesthesia: Gen.  Anesthesiologist: Dr. Leilani Able  Procedure: Robotically-assisted laparoscopic left ovarian cystectomy, pelvic washings  Surgeon: Dr. Dois Davenport Aroura Vasudevan  Assistant: Henreitta Leber PA-C  Procedure:  After being informed of the planned procedure with possible complications including bleeding, infection, injury to other organs as well as need for laparotomy and need for subsequent surgery, informed consent is obtained and patient is taken to or #7. She is given general anesthesia with endotracheal intubation without any complication. She is placed in lithotomy position, on is taking Naprosyn and beanbag, both arms are padded and tucked and wearing a knee-high sequential compressive devices. She's prepped and draped in the style fashion and a Foley catheter is inserted in her bladder.  Pelvic exam reveals a small anteverted uterus with a small right adnexa and a greatly enlarged left ovary to approximately 10 cm. It is smooth and mobile. A speculum is inserted in the vagina and anterior lip of the cervix was grasped with a tenaculum forcep. The uterus is sounded at 9.5 cm. We easily place a RUMI intrauterine manipulator and inflate it the balloon with 10 cc of saline. The tenaculum forcep was then removed.  We infiltrate 2 cm above the umbilicus with Marcaine 0.25 and perform a 10 mm vertical incision which is brought down bluntly to the fascia. Fascia is identified and grasped with Coker forceps. Fascia is then incised and peritoneum is entered bluntly. A pursestring suture of 0 Vicryl is placed on the fascia and a 12 mm Hassan trocar is inserted in the abdominal cavity. It is held in placed with the Purstring suture. This allows for easy insufflation of the pneumoperitoneum using warmed CO2 at a maximum pressure of 15 mm of mercury. The camera is inserted.  Observation: Anterior cul-de-sac is normal.  Uterus is normal. Posterior cul-de-sac is normal. Right tube and right ovary are normal. Left tube is wrapped around an enlarged left ovary measuring approximately 10 cm. Its surface is smooth and it is easily mobile with no adhesion. The utero-ovarian ligament is elongated and the vessel pattern is comb-like; all features compatible with a benign cyst.  Trocar placement is decided and we positioned a 8 mm robotic trocar on the left 8mm robotic trocar on the right and a 5 mm right patient side assistant trocar. All trocars are inserted under direct position after infiltrating with Marcaine 0.25. The robot was docked on the left side. A PK gyrus forcep is inserted in arm #2 and the monopolar scissors is inserted in arm number one. Preparation and docking takes 50 minutes.  We initially proceeded with pelvic washings and then using the uterus, we scoped the ovary out of the posterior cul-de-sac and move it anteriorly. With monopolar scissors we can now lightly cauterized the surface which gives [place] access to the ovarian capsule. Using blunt dissection we are able to open the ovarian capsule on a distance of approximately 8 cm. The scissors is removed for fine-tipped forcep which then allows [place] to bluntly dissect the ovarian cyst away from the ovarian capsule. We are able to release about a third of the cyst until it ruptures in evacuating a large amount of clear fluid. This gives [place] a better grasp being Capability on the cyst wall. We can then easily complete the cystectomy by traction countertraction. We then irrigated profusely and systematically cauterize every site of bleeding inside the ovarian capsule. The 10 mm camera is removed and changed for 8 mm  camera which is then inserted in the right robotic trocar after it has been undocked. The 10 mm trocar is also removed from the robot to give [place] easy access for removal of the specimen which is then sent for frozen section.  We returned to the 10 mm camera and  robotic approach. We again irrigated profusely with warm saline and complete hemostasis at the hilum with cauterization. All instruments were then removed. The robot is removed. All trocars are removed after evacuating the pneumoperitoneum. The fascia of the umbilical incision is closed with the previously placed pursestring suture. The skin of all 4 incision is closed with a subcuticular suture of 3-0 Monocryl and Dermabond.  The room he intrauterine manipulator is easily removed leaving in place the Mirena IUD. Bleeding on the anterior lip of the cervix is controlled with application of silver nitrate.  Dr. 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  Instrument and sponge count is complete x2.  The procedures very well tolerated by the patient is taken to recovery room in a well and stable condition.  Specimen: Pelvic washings and left ovarian cyst wall to pathology

## 2011-04-28 NOTE — Discharge Planning (Cosign Needed)
Physician Discharge Summary  Patient ID: Charlene Scott MRN: [id number] DOB/AGE: 23-08-1990 20 y.o.  Admit date: 04/28/2011 Discharge date: 04/28/2011  Admission Diagnoses: Left Ovarian Cyst  Discharge Diagnoses: Left Ovarian Cyst        Principal Problem:  *Ovarian cyst  Operation: Robot-Assisted Laparoscopic Left Ovarian Cystectomy, frozen section and pelvic washings.  Discharged Condition: Good  Hospital Course: On date of admission, the patient underwent a Robot-Assisted Laparoscopic Left Ovarian Cystectomy, pelvic washings and frozen section evaluation of specimen.  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  Patient tolerated all procedures well and was therefore discharged home from PACU.  Consults: none  Treatments: none  Disposition:   Discharge Orders    Future Appointments: Provider: Department: Dept Phone: Center:   05/19/2011 8:30 AM Esmeralda Arthur, MD [facility] [phone] None     Medication List  As of 04/28/2011  4:55 PM   STOP taking these medications         OVER THE COUNTER MEDICATION         TAKE these medications         HYDROcodone-acetaminophen 5-500 MG per tablet   Commonly known as: VICODIN   Take 1 tablet by mouth every 6 (six) hours as needed for pain.      ibuprofen 600 MG tablet   Commonly known as: ADVIL,MOTRIN   Take 1 tablet (600 mg total) by mouth every 6 (six) hours as needed for pain.           Follow-up Information    Follow up with [facility], MD on 05/19/2011. (8: 30 am)    Contact information:   [address] [place] [address] [phone]          Signed: Patrick Jupiter  04/28/2011, 4:55 PM

## 2011-04-28 NOTE — Interval H&P Note (Signed)
History and Physical Interval Note:  04/28/2011 1:43 PM  Charlene Scott  has presented today for surgery, with the diagnosis of Left Ovarian Cyst  The various methods of treatment have been discussed with the patient and family. After consideration of risks, benefits and other options for treatment, the patient has consented to  Procedure(s) (LRB): ROBOTIC ASSISTED LAPAROSCOPIC OVARIAN CYSTECTOMY (Left) as a surgical intervention .  The patients' history has been reviewed, patient examined, no change in status, stable for surgery.  I have reviewed the patients' chart and labs.  Questions were answered to the patient's satisfaction.     Caylan Chenard A

## 2011-04-29 ENCOUNTER — Other Ambulatory Visit: Payer: Self-pay | Admitting: Obstetrics and Gynecology

## 2011-05-19 ENCOUNTER — Encounter (INDEPENDENT_AMBULATORY_CARE_PROVIDER_SITE_OTHER): Payer: BC Managed Care – PPO | Admitting: Obstetrics and Gynecology

## 2011-05-19 DIAGNOSIS — N83209 Unspecified ovarian cyst, unspecified side: Secondary | ICD-10-CM

## 2011-09-14 ENCOUNTER — Telehealth: Payer: Self-pay | Admitting: Obstetrics and Gynecology

## 2011-09-14 NOTE — Telephone Encounter (Signed)
LAURA/SR PT °

## 2011-09-15 NOTE — Telephone Encounter (Signed)
LMTC @ 9:00  ld

## 2012-04-06 ENCOUNTER — Encounter: Payer: Self-pay | Admitting: Obstetrics and Gynecology

## 2012-04-06 ENCOUNTER — Ambulatory Visit: Payer: BC Managed Care – PPO | Admitting: Obstetrics and Gynecology

## 2012-04-06 VITALS — BP 94/70 | Wt 164.0 lb

## 2012-04-06 DIAGNOSIS — N898 Other specified noninflammatory disorders of vagina: Secondary | ICD-10-CM

## 2012-04-06 DIAGNOSIS — B9689 Other specified bacterial agents as the cause of diseases classified elsewhere: Secondary | ICD-10-CM

## 2012-04-06 DIAGNOSIS — Z331 Pregnant state, incidental: Secondary | ICD-10-CM | POA: Insufficient documentation

## 2012-04-06 LAB — POCT OSOM TRICHOMONAS RAPID TEST: Trichomonas vaginalis: NEGATIVE

## 2012-04-06 LAB — POCT OSOM BVBLUE TEST: Bacterial Vaginosis: POSITIVE

## 2012-04-06 MED ORDER — TERCONAZOLE 0.4 % VA CREA: 1.0000 | TOPICAL_CREAM | Freq: Every day | VAGINAL | Status: DC

## 2012-04-06 MED ORDER — METRONIDAZOLE 500 MG PO TABS: 500.0000 mg | ORAL_TABLET | Freq: Two times a day (BID) | ORAL | Status: AC

## 2012-04-06 NOTE — Progress Notes (Signed)
When did bleeding start: during /after intercourse started in may and has worsened 02/2012. How  Long: still going on now.  How often changing pad/tampon: 1-2 a day Bleeding Disorders: no Cramping: yes Contraception: yes Fibroids: no Hormone Therapy: no New Medications: no Menopausal Symptoms: no Vag. Discharge: yes Abdominal Pain: no Increased Stress: no  VAGINAL ODOR: Color: NONE Odor: yes Itching:no Thin:yes Thick:no Fever:no Dyspareunia:no Hx PID:no HX STD:no Pelvic Pain:no Desires Gc/CT:no Desires HIV,RPR,HbsAG:no

## 2012-04-06 NOTE — Progress Notes (Signed)
Calm, no distress, cooperative, HEENT WNL grossly,  lungs clear bilaterally, AP RRR,  abd soft nt,no masses, not tympanic bowel sounds active,  Normal hair distrubition mons pubis,  EGBUS WNL,  sterile speculum exam vagina pink, moist normal rugae,  normal appearing cervix without discharge or lesions LTC, no cervical motion tenderness, No adnexal masses or tenderness GC/CHL sent WET PREP +clue neg trich A  BV P flagyl, terazol RX discussed. Baking soda bathes x20 minutes BID no intercourse x 7 days F/O Prn, annually Charlene Scott, CNM

## 2012-04-07 LAB — GC/CHLAMYDIA PROBE AMP: GC Probe RNA: NEGATIVE

## 2012-05-10 ENCOUNTER — Ambulatory Visit: Payer: BC Managed Care – PPO | Admitting: Obstetrics and Gynecology

## 2012-07-01 ENCOUNTER — Encounter (HOSPITAL_COMMUNITY): Payer: Self-pay | Admitting: *Deleted

## 2012-07-01 ENCOUNTER — Emergency Department (HOSPITAL_COMMUNITY)
Admission: EM | Admit: 2012-07-01 | Discharge: 2012-07-01 | Disposition: A | Discharge status: Home / Self Care | Payer: BC Managed Care – PPO | Attending: Emergency Medicine | Admitting: Emergency Medicine

## 2012-07-01 DIAGNOSIS — Y92410 Unspecified street and highway as the place of occurrence of the external cause: Secondary | ICD-10-CM | POA: Insufficient documentation

## 2012-07-01 DIAGNOSIS — Z8659 Personal history of other mental and behavioral disorders: Secondary | ICD-10-CM | POA: Insufficient documentation

## 2012-07-01 DIAGNOSIS — R011 Cardiac murmur, unspecified: Secondary | ICD-10-CM | POA: Insufficient documentation

## 2012-07-01 DIAGNOSIS — Y9389 Activity, other specified: Secondary | ICD-10-CM | POA: Insufficient documentation

## 2012-07-01 DIAGNOSIS — F17200 Nicotine dependence, unspecified, uncomplicated: Secondary | ICD-10-CM | POA: Insufficient documentation

## 2012-07-01 DIAGNOSIS — S0003XA Contusion of scalp, initial encounter: Secondary | ICD-10-CM | POA: Insufficient documentation

## 2012-07-01 DIAGNOSIS — Z8709 Personal history of other diseases of the respiratory system: Secondary | ICD-10-CM | POA: Insufficient documentation

## 2012-07-01 MED ORDER — IBUPROFEN 800 MG PO TABS: 800.0000 mg | ORAL_TABLET | Freq: Three times a day (TID) | ORAL | Status: DC

## 2012-07-01 MED ORDER — CYCLOBENZAPRINE HCL 10 MG PO TABS: 10.0000 mg | ORAL_TABLET | Freq: Two times a day (BID) | ORAL | Status: DC | PRN
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
## 2012-07-01 NOTE — ED Notes (Signed)
Pt ambulatory to exam room with steady gait. Pt arrives with family members.
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
## 2012-07-02 NOTE — ED Provider Notes (Signed)
Medical screening examination/treatment/procedure(s) were performed by non-physician practitioner and as supervising physician I was immediately available for consultation/collaboration.  Doug Sou, MD 07/02/12 Jacinta Shoe

## 2012-09-26 DIAGNOSIS — N870 Mild cervical dysplasia: Secondary | ICD-10-CM | POA: Insufficient documentation

## 2012-09-26 DIAGNOSIS — B977 Papillomavirus as the cause of diseases classified elsewhere: Secondary | ICD-10-CM | POA: Insufficient documentation

## 2014-01-14 ENCOUNTER — Encounter (HOSPITAL_COMMUNITY): Payer: Self-pay | Admitting: *Deleted

## 2014-06-04 ENCOUNTER — Encounter: Payer: Self-pay | Admitting: Family Medicine

## 2015-04-14 ENCOUNTER — Ambulatory Visit: Payer: BC Managed Care – PPO | Admitting: Physician Assistant

## 2015-04-14 ENCOUNTER — Encounter: Payer: Self-pay | Admitting: Physician Assistant

## 2015-04-14 ENCOUNTER — Encounter: Payer: Self-pay | Admitting: Family Medicine

## 2015-04-14 ENCOUNTER — Ambulatory Visit (INDEPENDENT_AMBULATORY_CARE_PROVIDER_SITE_OTHER): Payer: BC Managed Care – PPO | Admitting: Physician Assistant

## 2015-04-14 VITALS — BP 102/60 | HR 76 | Temp 98.3°F | Resp 18 | Wt 177.0 lb

## 2015-04-14 DIAGNOSIS — J988 Other specified respiratory disorders: Principal | ICD-10-CM

## 2015-04-14 DIAGNOSIS — B9789 Other viral agents as the cause of diseases classified elsewhere: Secondary | ICD-10-CM

## 2015-04-14 DIAGNOSIS — B349 Viral infection, unspecified: Secondary | ICD-10-CM | POA: Diagnosis not present

## 2015-04-14 NOTE — Progress Notes (Signed)
    Patient ID: NATALEY DESTEFANIS MRN: [id number], DOB: [birth year], 25 y.o. Date of Encounter: 04/14/2015, 12:48 PM    Chief Complaint:  Chief Complaint  Patient presents with  . sick x 3 days    drowsy cough, congestion,feels lousy, has been using Mucinex     HPI: 25 y.o. year old white female presents with the above. She states the symptoms started Friday 04/11/15. Says in general she just feels lousy and feels very tired and drained. A lot of congestion in her head and nose. Says that she does cough up some drainage but feels that it is just drainage down her throat and does not feel that it is in her chest. No fevers or chills. No sore throat.  Works at [employer].     Home Meds:   Outpatient Prescriptions Prior to Visit  Medication Sig Dispense Refill  . ibuprofen (ADVIL,MOTRIN) 200 MG tablet Take 400 mg by mouth every 6 (six) hours as needed for pain.    Marland Kitchen levonorgestrel (MIRENA) 20 MCG/24HR IUD 1 each by Intrauterine route continuous.     . Multiple Vitamin (MULTIVITAMIN WITH MINERALS) TABS Take 1 tablet by mouth every morning.    . cyclobenzaprine (FLEXERIL) 10 MG tablet Take 1 tablet (10 mg total) by mouth 2 (two) times daily as needed for muscle spasms. 20 tablet 0  . ibuprofen (ADVIL,MOTRIN) 800 MG tablet Take 1 tablet (800 mg total) by mouth 3 (three) times daily. 21 tablet 0   No facility-administered medications prior to visit.    Allergies: No Known Allergies    Review of Systems: See HPI for pertinent ROS. All other ROS negative.    Physical Exam: Blood pressure 102/60, pulse 76, temperature 98.3 F (36.8 C), temperature source Oral, resp. rate 18, weight 177 lb (80.287 kg)., Body mass index is 27.72 kg/(m^2). General:  WNWD WF. Appears in no acute distress. HEENT: Normocephalic, atraumatic, eyes without discharge, sclera non-icteric, nares are without discharge. Bilateral auditory canals clear, TM's are without perforation, pearly grey and translucent with  reflective cone of light bilaterally. Oral cavity moist, posterior pharynx without exudate, erythema, peritonsillar abscess. No tenderness with percussion to frontal or maxillary sinuses bilaterally.  Neck: Supple. No thyromegaly. No lymphadenopathy. Lungs: Clear bilaterally to auscultation without wheezes, rales, or rhonchi. Breathing is unlabored. Heart: Regular rhythm. No murmurs, rubs, or gallops. Msk:  Strength and tone normal for age. Extremities/Skin: Warm and dry.  Neuro: Alert and oriented X 3. Moves all extremities spontaneously. Gait is normal. CNII-XII grossly in tact. Psych:  Responds to questions appropriately with a normal affect.     ASSESSMENT AND PLAN:  25 y.o. year old female with  1. Viral respiratory infection Discussed with her that this is consistent with a viral infection. Should run its course and resolve on its own in a week.  Gave her a note to be out of work today and tomorrow with plans to return Wednesday. Told her that if symptoms are even worse Wednesday to call us to follow-up at that time. Use decongestants and cough medications Tylenol and Motrin for symptom relief in the interim.   [address] [place], [place], [facility] 04/14/2015 12:48 PM

## 2015-08-22 ENCOUNTER — Encounter: Payer: Self-pay | Admitting: Family Medicine

## 2015-08-22 ENCOUNTER — Ambulatory Visit (INDEPENDENT_AMBULATORY_CARE_PROVIDER_SITE_OTHER): Payer: BC Managed Care – PPO | Admitting: Family Medicine

## 2015-08-22 VITALS — BP 118/82 | HR 63 | Temp 98.3°F | Resp 14 | Ht 67.0 in | Wt 174.0 lb

## 2015-08-22 DIAGNOSIS — K5909 Other constipation: Secondary | ICD-10-CM

## 2015-08-22 DIAGNOSIS — L659 Nonscarring hair loss, unspecified: Secondary | ICD-10-CM

## 2015-08-22 DIAGNOSIS — Z0000 Encounter for general adult medical examination without abnormal findings: Secondary | ICD-10-CM | POA: Diagnosis not present

## 2015-08-22 DIAGNOSIS — Z23 Encounter for immunization: Secondary | ICD-10-CM

## 2015-08-22 LAB — CBC WITH DIFFERENTIAL/PLATELET
BASOS PCT: 0 %
Basophils Absolute: 0 cells/uL (ref 0–200)
EOS PCT: 1 %
Eosinophils Absolute: 65 cells/uL (ref 15–500)
HCT: 40.6 % (ref 35.0–45.0)
Hemoglobin: 13.6 g/dL (ref 12.0–15.0)
LYMPHS PCT: 29 %
Lymphs Abs: 1885 cells/uL (ref 850–3900)
MCH: 30.9 pg (ref 27.0–33.0)
MCHC: 33.5 g/dL (ref 32.0–36.0)
MCV: 92.3 fL (ref 80.0–100.0)
MONOS PCT: 8 %
MPV: 10.2 fL (ref 7.5–12.5)
Monocytes Absolute: 520 cells/uL (ref 200–950)
NEUTROS ABS: 4030 {cells}/uL (ref 1500–7800)
Neutrophils Relative %: 62 %
PLATELETS: 232 10*3/uL (ref 140–400)
RBC: 4.4 MIL/uL (ref 3.80–5.10)
RDW: 14.2 % (ref 11.0–15.0)
WBC: 6.5 10*3/uL (ref 3.8–10.8)

## 2015-08-22 LAB — LIPID PANEL
CHOLESTEROL: 128 mg/dL (ref 125–200)
HDL: 51 mg/dL (ref >=46)
LDL Cholesterol: 66 mg/dL (ref <130)
TRIGLYCERIDES: 55 mg/dL (ref <150)
Total CHOL/HDL Ratio: 2.5 Ratio (ref <=5.0)
VLDL: 11 mg/dL (ref <30)

## 2015-08-22 LAB — COMPLETE METABOLIC PANEL WITH GFR
ALK PHOS: 60 U/L (ref 33–115)
ALT: 14 U/L (ref 6–29)
AST: 15 U/L (ref 10–30)
Albumin: 4.4 g/dL (ref 3.6–5.1)
BUN: 15 mg/dL (ref 7–25)
CALCIUM: 9.3 mg/dL (ref 8.6–10.2)
CHLORIDE: 105 mmol/L (ref 98–110)
CO2: 25 mmol/L (ref 20–31)
CREATININE: 0.72 mg/dL (ref 0.50–1.10)
GFR, Est Non African American: >89 mL/min (ref >=60)
Glucose, Bld: 99 mg/dL (ref 70–99)
POTASSIUM: 4.4 mmol/L (ref 3.5–5.3)
SODIUM: 140 mmol/L (ref 135–146)
Total Bilirubin: 0.4 mg/dL (ref 0.2–1.2)
Total Protein: 7.3 g/dL (ref 6.1–8.1)

## 2015-08-22 LAB — TSH: TSH: 2.86 mIU/L

## 2015-08-22 NOTE — Addendum Note (Signed)
Addended by: Amado Coe on: 08/22/2015 09:16 AM   Modules accepted: Orders, SmartSet

## 2015-08-22 NOTE — Progress Notes (Signed)
Subjective:    Patient ID: Charlene Scott, female    DOB: [birth year], 25 y.o.   MRN: [id number]  HPI Patient is a very pleasant 25 year old white female here today for complete physical exam. She does report some constipation as well as some thinning of her hair and mild hair loss. She is concerned because she has a family history of hypothyroidism. In fact her cousin who is only 12 was just diagnosed with hypothyroidism. She is requesting be screened for that. She is also due for screening lab work which she has never had done. Her Pap smear and her breast exam was performed at her gynecologist. She has had those done within the last year. She has been screened for HIV. Her tetanus shot is due Past Medical History  Diagnosis Date  . Heart murmur     as child, no problems   . Depression     no meds  . Recurrent upper respiratory infection (URI)     cold sx over last weekend, no meds   Past Surgical History  Procedure Laterality Date  . Wisdom tooth extraction    . Knee surgery      right knee surgery at age 1 yrs old  . Svd  2010    x 1   Current Outpatient Prescriptions on File Prior to Visit  Medication Sig Dispense Refill  . ibuprofen (ADVIL,MOTRIN) 200 MG tablet Take 400 mg by mouth every 6 (six) hours as needed for pain.    Marland Kitchen levonorgestrel (MIRENA) 20 MCG/24HR IUD 1 each by Intrauterine route continuous.     . Multiple Vitamin (MULTIVITAMIN WITH MINERALS) TABS Take 1 tablet by mouth every morning.     No current facility-administered medications on file prior to visit.   No Known Allergies Social History   Social History  . Marital Status: Single    Spouse Name: N/A  . Number of Children: N/A  . Years of Education: N/A   Occupational History  . Not on file.   Social History Main Topics  . Smoking status: Former Smoker -- 0.25 packs/day for 3 years    Types: Cigarettes    Quit date: 03/09/2011  . Smokeless tobacco: Never Used  . Alcohol Use: 0.0 oz/week    0  Standard drinks or equivalent per week     Comment: Socially  . Drug Use: No  . Sexual Activity:    Partners: Male    Birth Control/ Protection: IUD   Other Topics Concern  . Not on file   Social History Narrative   Family History  Problem Relation Age of Onset  . COPD Mother   . Diabetes Mother   . Vision loss Mother   . Diabetes Father   . Hypertension Father   . Diabetes Maternal Grandmother   . Heart disease Maternal Grandmother   . Hypertension Maternal Grandmother   . Hypertension Maternal Grandfather   . Arthritis Paternal Grandmother   . Cancer Paternal Grandmother   . Stroke Paternal Grandmother   . Arthritis Paternal Grandfather       Review of Systems  All other systems reviewed and are negative.      Objective:   Physical Exam  Constitutional: She is oriented to person, place, and time. She appears well-developed and well-nourished. No distress.  HENT:  Head: Normocephalic and atraumatic.  Right Ear: External ear normal.  Left Ear: External ear normal.  Nose: Nose normal.  Mouth/Throat: Oropharynx is clear and moist. No oropharyngeal exudate.  Eyes: Conjunctivae and EOM are normal. Pupils are equal, round, and reactive to light. Right eye exhibits no discharge. Left eye exhibits no discharge. No scleral icterus.  Neck: Normal range of motion. Neck supple. No JVD present. No tracheal deviation present. No thyromegaly present.  Cardiovascular: Normal rate, regular rhythm, normal heart sounds and intact distal pulses.  Exam reveals no gallop and no friction rub.   No murmur heard. Pulmonary/Chest: Effort normal and breath sounds normal. No stridor. No respiratory distress. She has no wheezes. She has no rales. She exhibits no tenderness.  Abdominal: Soft. Bowel sounds are normal. She exhibits no distension and no mass. There is no tenderness. There is no rebound and no guarding.  Musculoskeletal: Normal range of motion. She exhibits no edema or tenderness.    Lymphadenopathy:    She has no cervical adenopathy.  Neurological: She is alert and oriented to person, place, and time. She has normal reflexes. She displays normal reflexes. No cranial nerve deficit. She exhibits normal muscle tone. Coordination normal.  Skin: Skin is warm. No rash noted. She is not diaphoretic. No erythema. No pallor.  Psychiatric: She has a normal mood and affect. Her behavior is normal. Judgment and thought content normal.  Vitals reviewed.         Assessment & Plan:  Routine general medical examination at a health care facility - Plan: CBC with Differential/Platelet, COMPLETE METABOLIC PANEL WITH GFR, Lipid panel, TSH  Other constipation - Plan: TSH  Alopecia - Plan: TSH  Physical exam today is completely normal. I will check a CBC, CMP, fasting lipid panel. I will also check a TSH because of her other symptoms. I recommended calcium and vitamin D to prevent osteoporosis. She is already been screened for breast cancer and cervical cancer. She will receive her Tdap today.

## 2016-12-21 ENCOUNTER — Telehealth: Payer: Self-pay | Admitting: Family Medicine

## 2016-12-21 NOTE — Telephone Encounter (Signed)
[facility]  Patient going on cruise, would like to know if she can get a patch called in for motion sickness  [phone]

## 2016-12-21 NOTE — Telephone Encounter (Signed)
Scopolamine 1 patch transdermally behind the ear every 72 hours dispense one box

## 2016-12-21 NOTE — Telephone Encounter (Signed)
See note below

## 2016-12-22 MED ORDER — SCOPOLAMINE 1 MG/3DAYS TD PT72: MEDICATED_PATCH | TRANSDERMAL | 0 refills | Status: DC

## 2016-12-22 NOTE — Telephone Encounter (Signed)
rx sent to [facility]

## 2017-04-26 ENCOUNTER — Encounter: Payer: Self-pay | Admitting: Family Medicine

## 2017-04-26 ENCOUNTER — Ambulatory Visit: Payer: BC Managed Care – PPO | Admitting: Family Medicine

## 2017-04-26 VITALS — BP 100/64 | HR 114 | Temp 98.6°F | Resp 14 | Ht 67.5 in | Wt 173.0 lb

## 2017-04-26 DIAGNOSIS — R51 Headache: Secondary | ICD-10-CM

## 2017-04-26 DIAGNOSIS — J111 Influenza due to unidentified influenza virus with other respiratory manifestations: Secondary | ICD-10-CM | POA: Diagnosis not present

## 2017-04-26 DIAGNOSIS — R509 Fever, unspecified: Secondary | ICD-10-CM | POA: Diagnosis not present

## 2017-04-26 DIAGNOSIS — R519 Headache, unspecified: Secondary | ICD-10-CM

## 2017-04-26 LAB — INFLUENZA A AND B AG, IMMUNOASSAY
INFLUENZA A ANTIGEN: NOT DETECTED
INFLUENZA B ANTIGEN: NOT DETECTED

## 2017-04-26 MED ORDER — OSELTAMIVIR PHOSPHATE 75 MG PO CAPS: 75.0000 mg | ORAL_CAPSULE | Freq: Two times a day (BID) | ORAL | 0 refills | Status: DC

## 2017-04-26 NOTE — Progress Notes (Signed)
Subjective:    Patient ID: Charlene Scott, female    DOB: 1990-04-25, 27 y.o.   MRN: [id number]  HPI  The patient's symptoms began Sunday night.  Symptoms include high fever to 101.  They include diffuse body aches, a constant headache, head congestion, rhinorrhea, nonproductive cough.  She denies any sore throat.  She denies any chest pain.  She denies any sinus pain.  Headache is diffuse and all over.  There is no meningismus.  Daughter has similar viral symptoms Past Medical History:  Diagnosis Date  . Depression    no meds  . Heart murmur    as child, no problems   . Recurrent upper respiratory infection (URI)    cold sx over last weekend, no meds   Past Surgical History:  Procedure Laterality Date  . KNEE SURGERY     right knee surgery at age 41 yrs old  . SVD  2010   x 1  . WISDOM TOOTH EXTRACTION     Current Outpatient Medications on File Prior to Visit  Medication Sig Dispense Refill  . ibuprofen (ADVIL,MOTRIN) 200 MG tablet Take 400 mg by mouth every 6 (six) hours as needed for pain.    Marland Kitchen levonorgestrel (MIRENA) 20 MCG/24HR IUD 1 each by Intrauterine route continuous.     . Multiple Vitamin (MULTIVITAMIN WITH MINERALS) TABS Take 1 tablet by mouth every morning.     No current facility-administered medications on file prior to visit.    No Known Allergies Social History   Socioeconomic History  . Marital status: Single    Spouse name: Not on file  . Number of children: Not on file  . Years of education: Not on file  . Highest education level: Not on file  Social Needs  . Financial resource strain: Not on file  . Food insecurity - worry: Not on file  . Food insecurity - inability: Not on file  . Transportation needs - medical: Not on file  . Transportation needs - non-medical: Not on file  Occupational History  . Not on file  Tobacco Use  . Smoking status: Former Smoker    Packs/day: 0.25    Years: 3.00    Pack years: 0.75    Types: Cigarettes    Last  attempt to quit: 03/09/2011    Years since quitting: 6.1  . Smokeless tobacco: Never Used  Substance and Sexual Activity  . Alcohol use: Yes    Alcohol/week: 0.0 oz    Comment: Socially  . Drug use: No  . Sexual activity: Yes    Partners: Male    Birth control/protection: IUD  Other Topics Concern  . Not on file  Social History Narrative  . Not on file     Review of Systems  All other systems reviewed and are negative.      Objective:   Physical Exam  Constitutional: She appears well-developed and well-nourished. No distress.  HENT:  Right Ear: Tympanic membrane, external ear and ear canal normal.  Left Ear: Tympanic membrane, external ear and ear canal normal.  Nose: Mucosal edema and rhinorrhea present. Right sinus exhibits no maxillary sinus tenderness and no frontal sinus tenderness. Left sinus exhibits no maxillary sinus tenderness and no frontal sinus tenderness.  Mouth/Throat: Oropharynx is clear and moist. No oropharyngeal exudate.  Eyes: Conjunctivae are normal.  Neck: Neck supple.  Cardiovascular: Normal rate, regular rhythm and normal heart sounds.  No murmur heard. Pulmonary/Chest: Effort normal and breath sounds normal. No respiratory  distress. She has no wheezes. She has no rales.  Lymphadenopathy:    She has no cervical adenopathy.  Skin: She is not diaphoretic.  Vitals reviewed.         Assessment & Plan:  Fever, unspecified fever cause - Plan: Influenza A and B Ag, Immunoassay  Nonintractable headache, unspecified chronicity pattern, unspecified headache type - Plan: Influenza A and B Ag, Immunoassay  Influenza  Clinically the patient appears to have influenza.  I recommended Tamiflu 75 mg p.o. twice daily for 5 days.  I recommended Tylenol and/or ibuprofen for fever and body aches and headache.  I recommended Sudafed for nasal congestion and Mucinex DM for cough and chest congestion.  Anticipate gradual improvement over the next 7 days.  Recheck  immediately if worsening

## 2017-12-13 ENCOUNTER — Other Ambulatory Visit: Payer: Self-pay | Admitting: Gastroenterology

## 2017-12-13 DIAGNOSIS — R112 Nausea with vomiting, unspecified: Secondary | ICD-10-CM

## 2017-12-29 ENCOUNTER — Ambulatory Visit (HOSPITAL_COMMUNITY)
Admission: RE | Admit: 2017-12-29 | Discharge: 2017-12-29 | Disposition: A | Discharge status: Home / Self Care | Payer: BC Managed Care – PPO | Source: Ambulatory Visit | Attending: Gastroenterology | Admitting: Gastroenterology

## 2017-12-29 DIAGNOSIS — R112 Nausea with vomiting, unspecified: Secondary | ICD-10-CM | POA: Insufficient documentation

## 2017-12-29 MED ORDER — TECHNETIUM TC 99M MEBROFENIN IV KIT
5.3000 | PACK | Freq: Once | INTRAVENOUS | Status: AC | PRN
  Administered 2017-12-29: 5.3 via INTRAVENOUS

## 2018-05-05 ENCOUNTER — Ambulatory Visit (INDEPENDENT_AMBULATORY_CARE_PROVIDER_SITE_OTHER): Payer: BC Managed Care – PPO | Admitting: Family Medicine

## 2018-05-05 ENCOUNTER — Encounter: Payer: Self-pay | Admitting: Family Medicine

## 2018-05-05 VITALS — BP 110/78 | HR 80 | Temp 98.1°F | Resp 16 | Ht 67.75 in | Wt 183.0 lb

## 2018-05-05 DIAGNOSIS — R638 Other symptoms and signs concerning food and fluid intake: Secondary | ICD-10-CM

## 2018-05-05 DIAGNOSIS — Z0001 Encounter for general adult medical examination with abnormal findings: Secondary | ICD-10-CM

## 2018-05-05 DIAGNOSIS — E663 Overweight: Secondary | ICD-10-CM

## 2018-05-05 DIAGNOSIS — Z0000 Encounter for general adult medical examination without abnormal findings: Secondary | ICD-10-CM

## 2018-05-05 LAB — LIPID PANEL
Cholesterol: 133 mg/dL (ref <200)
HDL: 43 mg/dL — ABNORMAL LOW (ref >=50)
LDL CHOLESTEROL (CALC): 73 mg/dL
NON-HDL CHOLESTEROL (CALC): 90 mg/dL (ref <130)
TRIGLYCERIDES: 87 mg/dL (ref <150)
Total CHOL/HDL Ratio: 3.1 (calc) (ref <5.0)

## 2018-05-05 LAB — COMPLETE METABOLIC PANEL WITH GFR
AG Ratio: 1.9 (calc) (ref 1.0–2.5)
ALBUMIN MSPROF: 4.7 g/dL (ref 3.6–5.1)
ALT: 14 U/L (ref 6–29)
AST: 13 U/L (ref 10–30)
Alkaline phosphatase (APISO): 66 U/L (ref 31–125)
BILIRUBIN TOTAL: 0.3 mg/dL (ref 0.2–1.2)
BUN: 15 mg/dL (ref 7–25)
CALCIUM: 9.4 mg/dL (ref 8.6–10.2)
CHLORIDE: 103 mmol/L (ref 98–110)
CO2: 26 mmol/L (ref 20–32)
Creat: 0.82 mg/dL (ref 0.50–1.10)
GFR, EST AFRICAN AMERICAN: 112 mL/min/{1.73_m2} (ref >=60)
GFR, EST NON AFRICAN AMERICAN: 97 mL/min/{1.73_m2} (ref >=60)
Globulin: 2.5 g/dL (calc) (ref 1.9–3.7)
Glucose, Bld: 85 mg/dL (ref 65–99)
Potassium: 4 mmol/L (ref 3.5–5.3)
Sodium: 140 mmol/L (ref 135–146)
TOTAL PROTEIN: 7.2 g/dL (ref 6.1–8.1)

## 2018-05-05 LAB — CBC WITH DIFFERENTIAL/PLATELET
ABSOLUTE MONOCYTES: 724 {cells}/uL (ref 200–950)
BASOS PCT: 0.3 %
Basophils Absolute: 28 cells/uL (ref 0–200)
EOS ABS: 122 {cells}/uL (ref 15–500)
Eosinophils Relative: 1.3 %
HCT: 38.1 % (ref 35.0–45.0)
HEMOGLOBIN: 13.3 g/dL (ref 11.7–15.5)
Lymphs Abs: 3027 cells/uL (ref 850–3900)
MCH: 31.4 pg (ref 27.0–33.0)
MCHC: 34.9 g/dL (ref 32.0–36.0)
MCV: 90.1 fL (ref 80.0–100.0)
MPV: 11.1 fL (ref 7.5–12.5)
Monocytes Relative: 7.7 %
NEUTROS ABS: 5499 {cells}/uL (ref 1500–7800)
Neutrophils Relative %: 58.5 %
PLATELETS: 216 10*3/uL (ref 140–400)
RBC: 4.23 10*6/uL (ref 3.80–5.10)
RDW: 12.8 % (ref 11.0–15.0)
Total Lymphocyte: 32.2 %
WBC: 9.4 10*3/uL (ref 3.8–10.8)

## 2018-05-05 LAB — TSH: TSH: 2.3 m[IU]/L

## 2018-05-05 MED ORDER — LINACLOTIDE 145 MCG PO CAPS: 145.0000 ug | ORAL_CAPSULE | Freq: Every day | ORAL | 5 refills | Status: DC

## 2018-05-05 NOTE — Progress Notes (Signed)
Subjective:    Patient ID: Charlene Scott, female    DOB: 1990-07-08, 28 y.o.   MRN: [id number]  HPI Patient is a very pleasant 28 year old white female here today for complete physical exam. Her pap smear and her breast exam was performed at her gynecologist.  Patient performs monthly self breast exams.  Last fall, the patient was having right-sided abdominal pain.  She saw her gynecologist.  There was no gynecologic issues causing the pain.  She was referred to a gastroenterologist who performed an ultrasound which did show early signs of fatty liver disease.  She was then referred for a HIDA scan.  Patient had a normal HIDA scan last year in October.  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  Patient states that since she has been taking the Linzess, the pain has improved.  She does feel better.  However she would like to try a lower dose due to diarrhea.  Otherwise the patient has been doing well.  After being told that she had early signs of fatty liver disease, she is started meal planning.  She has been getting exercise although she admits that she is not able to get the intense exercise she was getting before.  She does try to stay active 30 minutes a day 5 days a week.  She is due for a flu shot which she politely declines.  She has had HIV screening through her previous pregnancy.  Her Pap smear was performed this year and they are screening every 3 years.  Past Medical History:  Diagnosis Date  . Depression    no meds  . Heart murmur    as child, no problems     Past Surgical History:  Procedure Laterality Date  . KNEE SURGERY     right knee surgery at age 28 yrs old  . SVD  2010   x 1  . WISDOM TOOTH EXTRACTION     Current Outpatient Medications on File Prior to Visit  Medication Sig Dispense Refill  . ibuprofen (ADVIL,MOTRIN) 200 MG tablet Take 400 mg by mouth every 6 (six) hours as needed for pain.    Marland Kitchen levonorgestrel (MIRENA) 20  MCG/24HR IUD 1 each by Intrauterine route continuous.     . Multiple Vitamin (MULTIVITAMIN WITH MINERALS) TABS Take 1 tablet by mouth every morning.    Marland Kitchen oseltamivir (TAMIFLU) 75 MG capsule Take 1 capsule (75 mg total) by mouth 2 (two) times daily. 10 capsule 0   No current facility-administered medications on file prior to visit.    No Known Allergies Social History   Socioeconomic History  . Marital status: Single    Spouse name: Not on file  . Number of children: Not on file  . Years of education: Not on file  . Highest education level: Not on file  Occupational History  . Not on file  Social Needs  . Financial resource strain: Not on file  . Food insecurity:    Worry: Not on file    Inability: Not on file  . Transportation needs:    Medical: Not on file    Non-medical: Not on file  Tobacco Use  . Smoking status: Former Smoker    Packs/day: 0.25    Years: 3.00    Pack years: 0.75    Types: Cigarettes    Last attempt to quit: 03/09/2011    Years since quitting: 7.1  . Smokeless tobacco: Never Used  Substance and Sexual  Activity  . Alcohol use: Yes    Alcohol/week: 0.0 standard drinks    Comment: Socially  . Drug use: No  . Sexual activity: Yes    Partners: Male    Birth control/protection: I.U.D.  Lifestyle  . Physical activity:    Days per week: Not on file    Minutes per session: Not on file  . Stress: Not on file  Relationships  . Social connections:    Talks on phone: Not on file    Gets together: Not on file    Attends religious service: Not on file    Active member of club or organization: Not on file    Attends meetings of clubs or organizations: Not on file    Relationship status: Not on file  . Intimate partner violence:    Fear of current or ex partner: Not on file    Emotionally abused: Not on file    Physically abused: Not on file    Forced sexual activity: Not on file  Other Topics Concern  . Not on file  Social History Narrative  . Not on  file   Family History  Problem Relation Age of Onset  . COPD Mother   . Diabetes Mother   . Vision loss Mother   . Diabetes Father   . Hypertension Father   . Diabetes Maternal Grandmother   . Heart disease Maternal Grandmother   . Hypertension Maternal Grandmother   . Hypertension Maternal Grandfather   . Arthritis Paternal Grandmother   . Cancer Paternal Grandmother   . Stroke Paternal Grandmother   . Arthritis Paternal Grandfather       Review of Systems  All other systems reviewed and are negative.      Objective:   Physical Exam  Constitutional: She is oriented to person, place, and time. She appears well-developed and well-nourished. No distress.  HENT:  Head: Normocephalic and atraumatic.  Right Ear: External ear normal.  Left Ear: External ear normal.  Nose: Nose normal.  Mouth/Throat: Oropharynx is clear and moist. No oropharyngeal exudate.  Eyes: Pupils are equal, round, and reactive to light. Conjunctivae and EOM are normal. Right eye exhibits no discharge. Left eye exhibits no discharge. No scleral icterus.  Neck: Normal range of motion. Neck supple. No JVD present. No tracheal deviation present. No thyromegaly present.  Cardiovascular: Normal rate, regular rhythm, normal heart sounds and intact distal pulses. Exam reveals no gallop and no friction rub.  No murmur heard. Pulmonary/Chest: Effort normal and breath sounds normal. No stridor. No respiratory distress. She has no wheezes. She has no rales. She exhibits no tenderness.  Abdominal: Soft. Bowel sounds are normal. She exhibits no distension and no mass. There is no abdominal tenderness. There is no rebound and no guarding.  Musculoskeletal: Normal range of motion.        General: No tenderness or edema.  Lymphadenopathy:    She has no cervical adenopathy.  Neurological: She is alert and oriented to person, place, and time. She has normal reflexes. No cranial nerve deficit. She exhibits normal muscle  tone. Coordination normal.  Skin: Skin is warm. No rash noted. She is not diaphoretic. No erythema. No pallor.  Psychiatric: She has a normal mood and affect. Her behavior is normal. Judgment and thought content normal.  Vitals reviewed.         Assessment & Plan:  General medical exam - Plan: CBC with Differential/Platelet, COMPLETE METABOLIC PANEL WITH GFR, Lipid panel, TSH   Patient's  physical exam is completely normal.  I will send Linzess 145 mcg daily to her pharmacy.  She can try that and see if that helps continue to control her abdominal pain and not because of breakthrough diarrhea.  Offered the patient a flu shot but she politely declined.  Her Pap smear and breast exam are up-to-date through her gynecologist.  I will check a CBC, CMP, fasting lipid panel.  Patient states that she is been having a difficult time losing weight despite exercising and meal planning.  Therefore I will check a TSH as hypothyroidism runs in her family.  The remainder of her medical exam is normal.  Regular anticipatory guidance is provided.  I did encourage the patient to eat a more vegetarian diet and try to avoid saturated fats and fried foods and try to eat leaner meats such as fish and chicken.  I also recommended that she increase the intensity of her exercise in an effort to improve her weight loss.

## 2018-05-11 ENCOUNTER — Telehealth: Payer: Self-pay | Admitting: Family Medicine

## 2018-05-11 NOTE — Telephone Encounter (Signed)
Pt called and states that she forgot to mention to you if she could have something to help her lose weight. She has a fiend that takes Phentermine  and would like to try that if you think it is ok.

## 2018-05-12 MED ORDER — PHENTERMINE HCL 37.5 MG PO TABS: 37.5000 mg | ORAL_TABLET | Freq: Every day | ORAL | 2 refills | Status: DC

## 2018-05-12 NOTE — Telephone Encounter (Signed)
Pt aware.

## 2018-05-12 NOTE — Telephone Encounter (Signed)
I sent adipex to [facility].  She can take it in the AM for up to 3 months.

## 2018-08-11 ENCOUNTER — Ambulatory Visit (INDEPENDENT_AMBULATORY_CARE_PROVIDER_SITE_OTHER): Payer: BC Managed Care – PPO | Admitting: Family Medicine

## 2018-08-11 ENCOUNTER — Other Ambulatory Visit: Payer: Self-pay

## 2018-08-11 DIAGNOSIS — Z20828 Contact with and (suspected) exposure to other viral communicable diseases: Secondary | ICD-10-CM

## 2018-08-11 DIAGNOSIS — Z20822 Contact with and (suspected) exposure to covid-19: Secondary | ICD-10-CM

## 2018-08-11 NOTE — Progress Notes (Signed)
Subjective:    Patient ID: Charlene Scott, female    DOB: 06-20-1990, 28 y.o.   MRN: [id number]  HPI Patient is a very pleasant 28 year old female who is being seen today as a telephone visit.  She is currently at her work at the [employer].  I am currently in my office.  Phone call began at 1039.  Phone call concluded at 1049.  Patient was notified this morning that she may have been exposed to COVID-19.  On May 7, a family member visited their house and played with their children.  This family member was completely asymptomatic at that time.  On May 22 2 weeks later the patient developed symptoms and was tested and found to be positive for COVID-19.  We are now 22 days since the patient and her family were around the individual who tested positive.  The individual did not test positive and will 2 weeks after they were around the patient and her family.  Therefore I believe her risk of exposure to COVID-19 is extremely low.  She is completely asymptomatic.  Neither her nor her husband nor her children have any symptoms at all and they are not having any fever.  They deny any trouble breathing. Past Medical History:  Diagnosis Date  . Depression    no meds  . Heart murmur    as child, no problems    Past Surgical History:  Procedure Laterality Date  . KNEE SURGERY     right knee surgery at age 8 yrs old  . SVD  2010   x 1  . WISDOM TOOTH EXTRACTION     Current Outpatient Medications on File Prior to Visit  Medication Sig Dispense Refill  . ibuprofen (ADVIL,MOTRIN) 200 MG tablet Take 400 mg by mouth every 6 (six) hours as needed for pain.    Marland Kitchen levonorgestrel (MIRENA) 20 MCG/24HR IUD 1 each by Intrauterine route continuous.     Marland Kitchen linaclotide (LINZESS) 145 MCG CAPS capsule Take 1 capsule (145 mcg total) by mouth daily before breakfast. 30 capsule 5  . Multiple Vitamin (MULTIVITAMIN WITH MINERALS) TABS Take 1 tablet by mouth every morning.    . phentermine (ADIPEX-P) 37.5  MG tablet Take 1 tablet (37.5 mg total) by mouth daily before breakfast. 30 tablet 2   No current facility-administered medications on file prior to visit.    No Known Allergies Social History   Socioeconomic History  . Marital status: Single    Spouse name: Not on file  . Number of children: Not on file  . Years of education: Not on file  . Highest education level: Not on file  Occupational History  . Not on file  Social Needs  . Financial resource strain: Not on file  . Food insecurity:    Worry: Not on file    Inability: Not on file  . Transportation needs:    Medical: Not on file    Non-medical: Not on file  Tobacco Use  . Smoking status: Former Smoker    Packs/day: 0.25    Years: 3.00    Pack years: 0.75    Types: Cigarettes    Last attempt to quit: 03/09/2011    Years since quitting: 7.4  . Smokeless tobacco: Never Used  Substance and Sexual Activity  . Alcohol use: Yes    Alcohol/week: 0.0 standard drinks    Comment: Socially  . Drug use: No  . Sexual activity: Yes    Partners:  Male    Birth control/protection: I.U.D.  Lifestyle  . Physical activity:    Days per week: Not on file    Minutes per session: Not on file  . Stress: Not on file  Relationships  . Social connections:    Talks on phone: Not on file    Gets together: Not on file    Attends religious service: Not on file    Active member of club or organization: Not on file    Attends meetings of clubs or organizations: Not on file    Relationship status: Not on file  . Intimate partner violence:    Fear of current or ex partner: Not on file    Emotionally abused: Not on file    Physically abused: Not on file    Forced sexual activity: Not on file  Other Topics Concern  . Not on file  Social History Narrative  . Not on file      Review of Systems  All other systems reviewed and are negative.      Objective:   Physical Exam  No physical exam was performed today as the patient was  seen as a telephone visit however she was speaking full and complete sentences over the telephone with no respiratory distress      Assessment & Plan:  Exposure to Covid-19 Virus  I explained to the patient that both she and her family were exposed to the patient more than 2 weeks prior to them testing positive for the virus.  At that time they had no symptoms and likely did not have the virus.  However even if we assume at that time but they may have had the virus, it has been more than 3 weeks since they were exposed and they remain completely asymptomatic.  Therefore from a clinical standpoint I believe they are fine and at no risk.  The patient's mother is concerned because she cares for the patient's children and I explained to the patient that they could seek testing through the health department if it would alleviate their fears.  I provided them the telephone number of [phone].  She can call anytime Monday through Friday between 10 AM and 3 PM and schedule testing at the [facility] parking deck through the health department.  However, I do not feel that there is any concern at this point given the duration of time since they were last around the individual and the fact the individual was asymptomatic and it was more than 2 weeks before they tested positive.

## 2018-10-23 ENCOUNTER — Other Ambulatory Visit: Payer: Self-pay

## 2018-10-23 DIAGNOSIS — Z20822 Contact with and (suspected) exposure to covid-19: Secondary | ICD-10-CM

## 2018-10-25 LAB — NOVEL CORONAVIRUS, NAA: SARS-CoV-2, NAA: NOT DETECTED

## 2018-12-19 ENCOUNTER — Other Ambulatory Visit: Payer: Self-pay | Admitting: Family Medicine

## 2019-02-07 IMAGING — US US ABDOMEN COMPLETE
1 series · 14 of 25 positions shown · non-contrast
Comparison: None.

CLINICAL DATA: Nausea, vomiting

EXAM:
ABDOMEN ULTRASOUND COMPLETE

[Series 1: us abdomen complete · 0.22mm/px · 81 acquisitions, 14 frames shown]
[im 1/81]
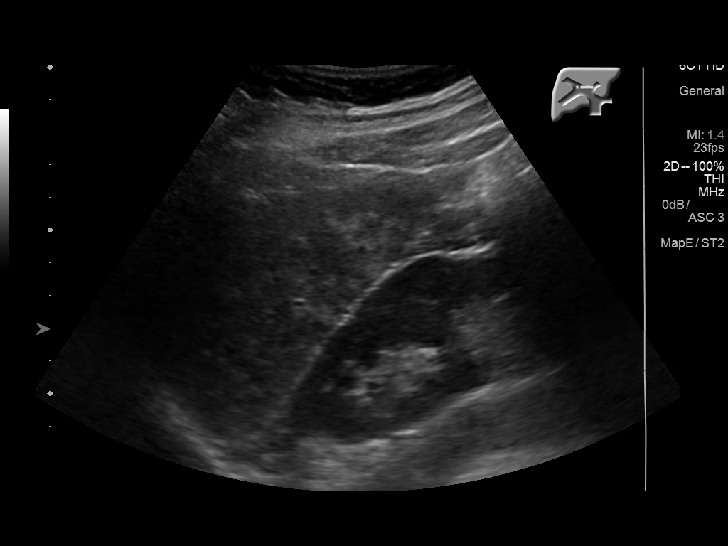
[im 7/81]
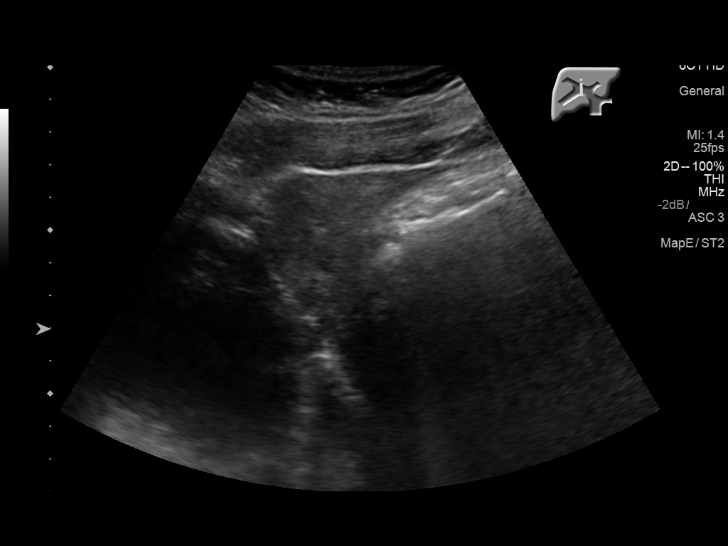
[im 14/81]
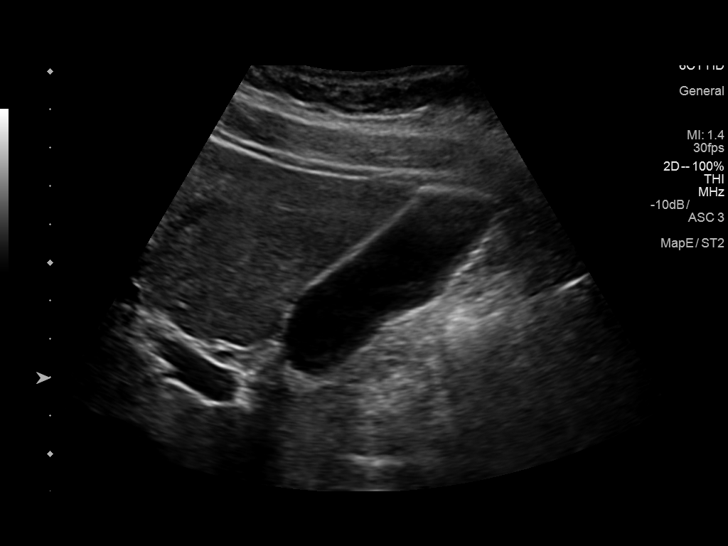
[im 21/81]
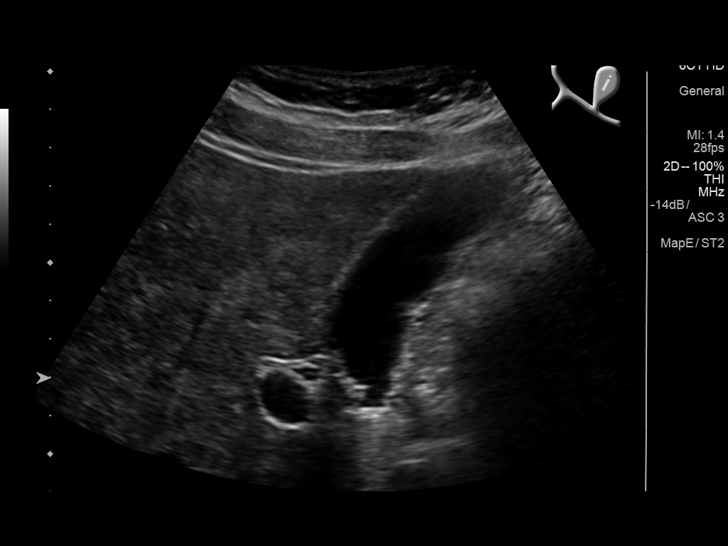
[im 27/81]
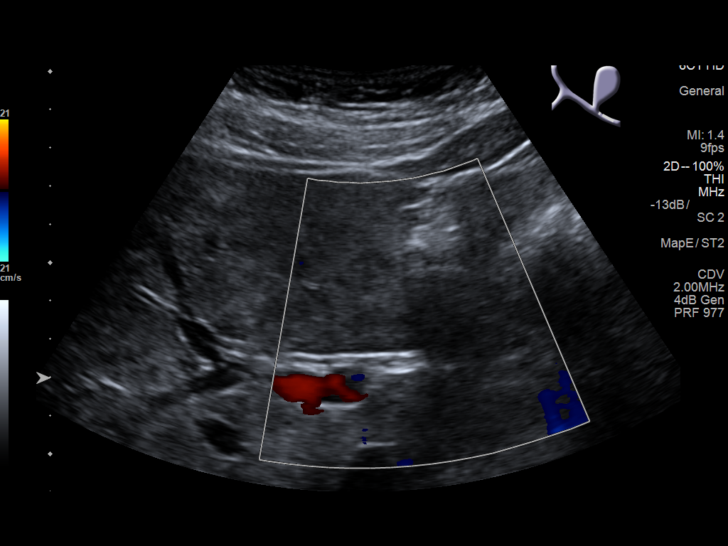
[im 31/81]
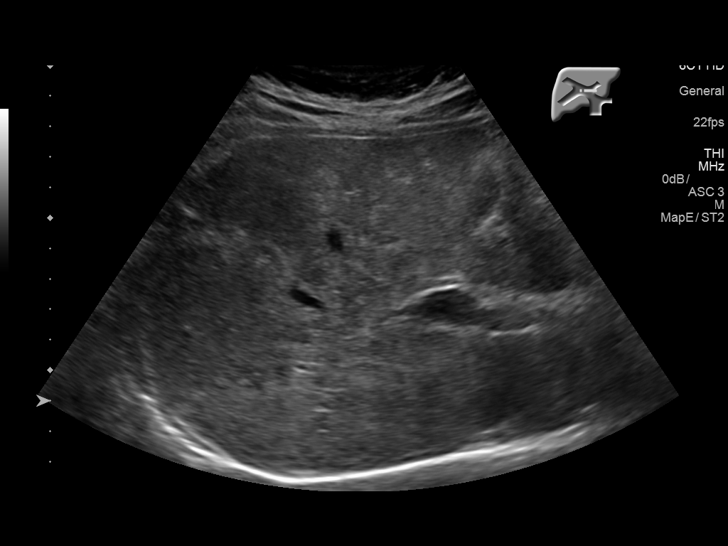
[im 37/81]
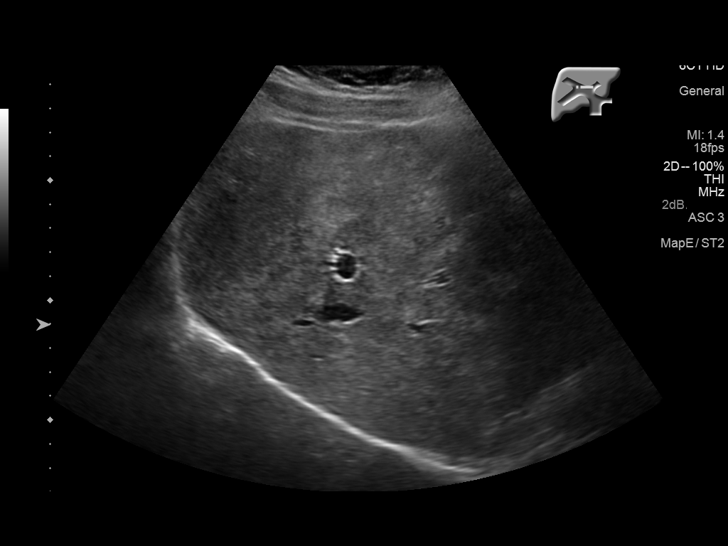
[im 44/81]
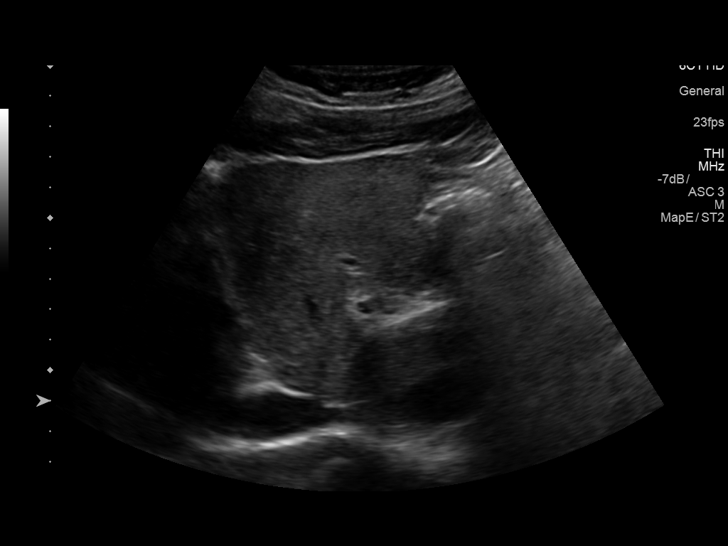
[im 51/81]
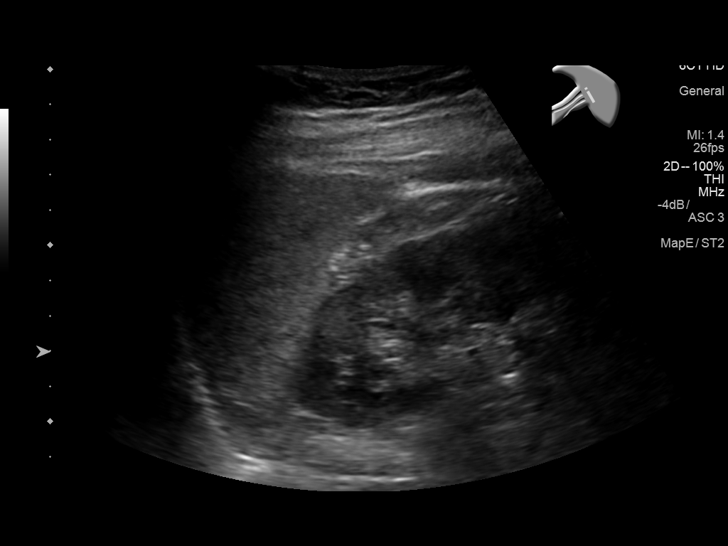
[im 54/81]
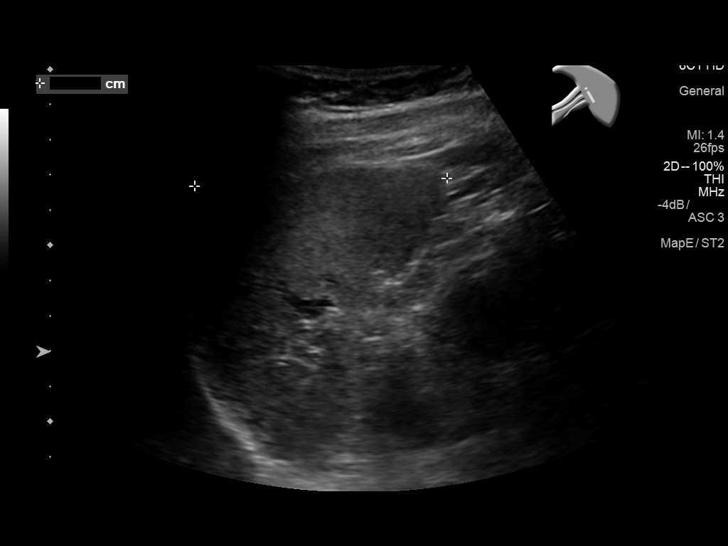
[im 61/81]
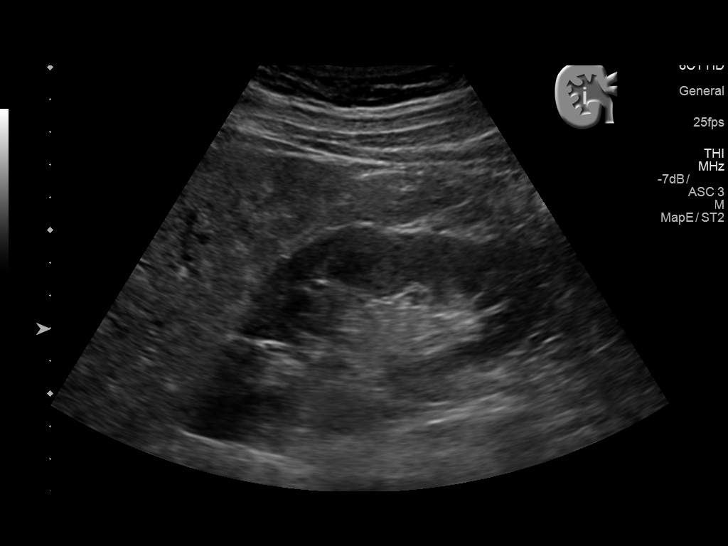
[im 67/81]
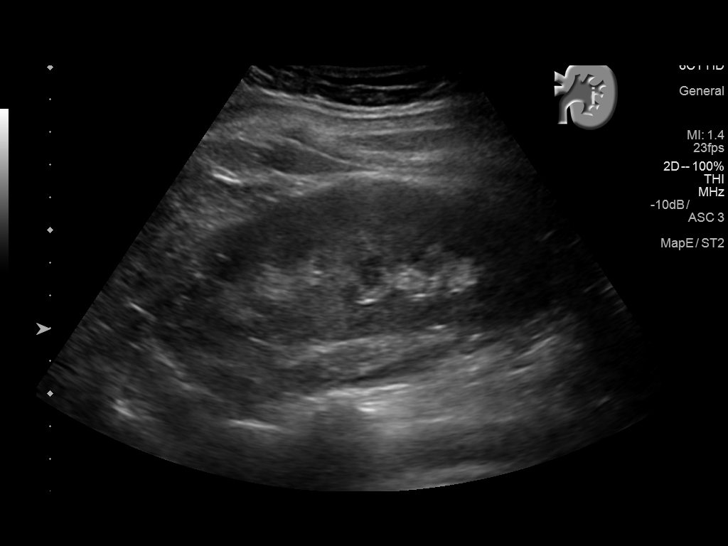
[im 74/81]
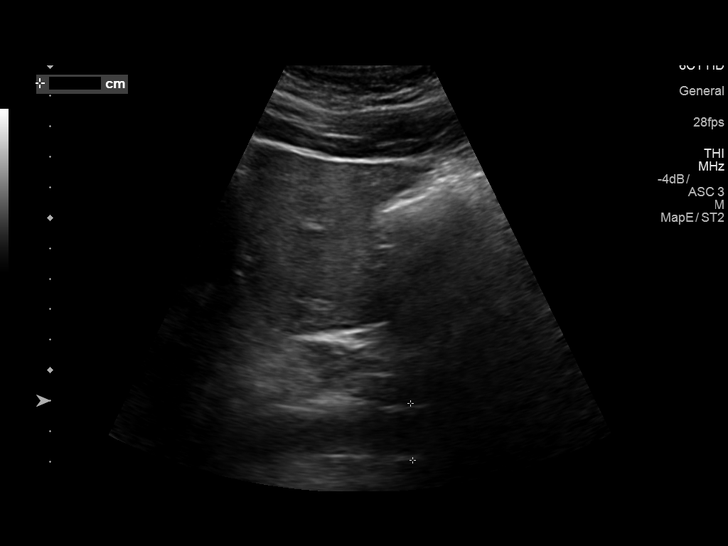
[im 81/81]
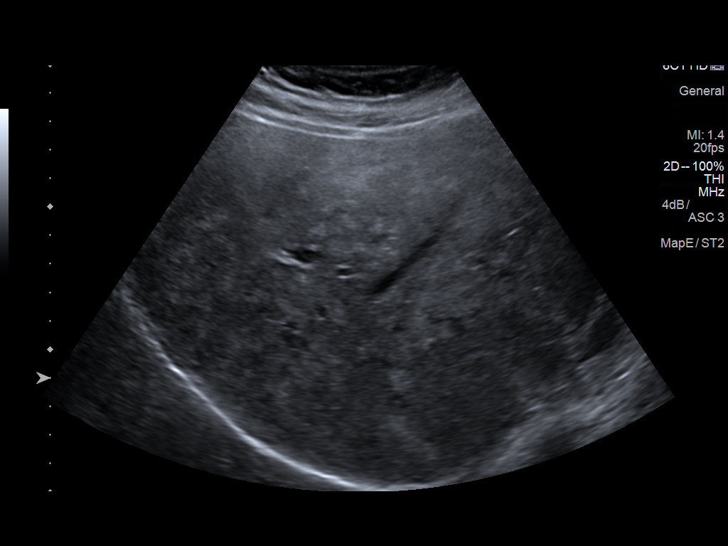

[14 of 25 positions shown; findings below may reference images not displayed]

FINDINGS: Gallbladder: No gallstones or wall thickening visualized. No
sonographic Murphy sign noted by sonographer.

Common bile duct: Diameter: Normal caliber, 3 mm

Liver: Markedly heterogeneous echotexture throughout the liver. No
visible focal measurable mass lesion. No ductal dilatation. Portal
vein is patent on color Doppler imaging with normal direction of
blood flow towards the liver.

IVC: No abnormality visualized.

Pancreas: Visualized portion unremarkable.

Spleen: Size and appearance within normal limits.

Right Kidney: Length: 12.2 cm. Echogenicity within normal limits. No
mass or hydronephrosis visualized.

Left Kidney: Length: 13.4 cm. Echogenicity within normal limits. No
mass or hydronephrosis visualized.

Abdominal aorta: No aneurysm visualized.

Other findings: None.
IMPRESSION: Markedly heterogeneous echotexture throughout the liver which could
reflect intrinsic liver disease, possibly hepatitis. No visible
focal measurable hepatic abnormality. Recommend clinical correlation
and correlation with LFTs.

Gallbladder unremarkable.

## 2019-03-19 ENCOUNTER — Ambulatory Visit: Payer: BC Managed Care – PPO | Attending: Internal Medicine

## 2019-03-19 DIAGNOSIS — Z20822 Contact with and (suspected) exposure to covid-19: Secondary | ICD-10-CM

## 2019-03-20 LAB — NOVEL CORONAVIRUS, NAA: SARS-CoV-2, NAA: NOT DETECTED

## 2019-04-20 ENCOUNTER — Other Ambulatory Visit: Payer: Self-pay

## 2019-04-20 ENCOUNTER — Encounter: Payer: Self-pay | Admitting: Family Medicine

## 2019-04-20 ENCOUNTER — Ambulatory Visit (INDEPENDENT_AMBULATORY_CARE_PROVIDER_SITE_OTHER): Payer: BC Managed Care – PPO | Admitting: Family Medicine

## 2019-04-20 VITALS — BP 110/70 | HR 90 | Temp 97.2°F | Resp 16 | Ht 67.5 in | Wt 179.0 lb

## 2019-04-20 DIAGNOSIS — L249 Irritant contact dermatitis, unspecified cause: Secondary | ICD-10-CM | POA: Diagnosis not present

## 2019-04-20 DIAGNOSIS — K648 Other hemorrhoids: Secondary | ICD-10-CM | POA: Diagnosis not present

## 2019-04-20 NOTE — Progress Notes (Signed)
Subjective:    Patient ID: Charlene Scott, female    DOB: 12-02-1990, 29 y.o.   MRN: [id number]  HPI Patient is here today for 2 reasons.  First she has dry skin under both eyes.  The patch of dry skin on each side is located exactly where her mask rest against her cheeks.  She is required to wear the mask at work due to the COVID-19 pandemic.  I believe the constant moisture from breathing is irritating her skin similar to an irritant dermatitis from like diaper rash.  The skin is irritated and erythematous.  It has small fine papules that are coalescing into a larger patch following the edge of the mask.  Today it is better.  The patient states she is been using moisturizers.  Second issue are hemorrhoids.  Patient states she has a small reducible internal hemorrhoid.  This is been present ever since childbirth.  The patient was in labor and straining for over 10 hours.  Ever since then she has had a small hemorrhoid that she describes as the diameter of her finger.  It will prolapse through the rectum.  It is easily reducible.  Recently it has become more irritated.  It will itch and burn on occasion and occasionally bleed when she wipes.  She has had to use Proctofoam on 2 separate occasions due to irritation in the last 3 months.  She is interested in options to help treat the hemorrhoid.  She is extremely embarrassed today and does not want me to examine the hemorrhoid.  Instead she simply wants to discuss options for treatment.  Past Medical History:  Diagnosis Date  . Depression    no meds  . Heart murmur    as child, no problems    Past Surgical History:  Procedure Laterality Date  . KNEE SURGERY     right knee surgery at age 74 yrs old  . SVD  2010   x 1  . WISDOM TOOTH EXTRACTION     Current Outpatient Medications on File Prior to Visit  Medication Sig Dispense Refill  . hydrocortisone-pramoxine (PROCTOFOAM-HC) rectal foam Place 1 applicator rectally 2 (two) times daily.    Marland Kitchen  levonorgestrel (MIRENA) 20 MCG/24HR IUD 1 each by Intrauterine route continuous.     Marland Kitchen LINZESS 145 MCG CAPS capsule TAKE 1 CAPSULE BY MOUTH EVERY DAY BEFORE BREAKFAST 30 capsule 5  . Multiple Vitamin (MULTIVITAMIN WITH MINERALS) TABS Take 1 tablet by mouth every morning.     No current facility-administered medications on file prior to visit.   No Known Allergies Social History   Socioeconomic History  . Marital status: Single    Spouse name: Not on file  . Number of children: Not on file  . Years of education: Not on file  . Highest education level: Not on file  Occupational History  . Not on file  Tobacco Use  . Smoking status: Former Smoker    Packs/day: 0.25    Years: 3.00    Pack years: 0.75    Types: Cigarettes    Quit date: 03/09/2011    Years since quitting: 8.1  . Smokeless tobacco: Never Used  Substance and Sexual Activity  . Alcohol use: Yes    Alcohol/week: 0.0 standard drinks    Comment: Socially  . Drug use: No  . Sexual activity: Yes    Partners: Male    Birth control/protection: I.U.D.  Other Topics Concern  . Not on file  Social History  Narrative  . Not on file   Social Determinants of Health   Financial Resource Strain:   . Difficulty of Paying Living Expenses: Not on file  Food Insecurity:   . Worried About Charity fundraiser in the Last Year: Not on file  . Ran Out of Food in the Last Year: Not on file  Transportation Needs:   . Lack of Transportation (Medical): Not on file  . Lack of Transportation (Non-Medical): Not on file  Physical Activity:   . Days of Exercise per Week: Not on file  . Minutes of Exercise per Session: Not on file  Stress:   . Feeling of Stress : Not on file  Social Connections:   . Frequency of Communication with Friends and Family: Not on file  . Frequency of Social Gatherings with Friends and Family: Not on file  . Attends Religious Services: Not on file  . Active Member of Clubs or Organizations: Not on file  .  Attends Archivist Meetings: Not on file  . Marital Status: Not on file  Intimate Partner Violence:   . Fear of Current or Ex-Partner: Not on file  . Emotionally Abused: Not on file  . Physically Abused: Not on file  . Sexually Abused: Not on file     Review of Systems  All other systems reviewed and are negative.      Objective:   Physical Exam  Constitutional: She appears well-developed and well-nourished. No distress.  HENT:  Head:    Eyes: Conjunctivae are normal.  Cardiovascular: Normal rate, regular rhythm and normal heart sounds.  No murmur heard. Pulmonary/Chest: Effort normal and breath sounds normal. No respiratory distress. She has no wheezes. She has no rales.  Musculoskeletal:     Cervical back: Neck supple.  Lymphadenopathy:    She has no cervical adenopathy.  Skin: She is not diaphoretic.  Vitals reviewed.         Assessment & Plan:  Irritant dermatitis  Internal hemorrhoid  I believe the rash on her face is due to wearing a mask 8 hours a day.  The mask directs humid air from breathing right over the skin in that area.  I believe this is likely irritating the skin similar to diaper rash causing an eczematiform eruption which is extremely mild in that area.  I have recommended that she remove the mask whenever possible to allow the skin to air out.  She can also use an over-the-counter steroid cream which is extremely mild such as cortisone cream very sparingly on occasion to help calm the rash.  It is difficult to determine the best course of treatment without examining the hemorrhoid however based on her description it sounds like she has an internal hemorrhoid that occasionally prolapses and becomes inflamed.  We discussed the natural history of hemorrhoids.  I explained that there is no cream that would cause the hemorrhoid to go away completely.  I explained that the cream simply helps calm the irritation when they become inflamed.  Otherwise  it is a swollen blood vessel that can prolapsed through the rectum.  We discussed a referral to gastroenterology for banding.  At the present time, the patient is reassured by our discussion and wants to avoid a referral for banding.  If the hemorrhoid continues to cause a problem she would like to see the gastroenterologist to discuss banding.  She was confused because she thought that the Proctofoam she was using would cause the hemorrhoid to go  away.  I recommended that she not use the Proctofoam every day, it will not dissolve the hemorrhoid it will only calm the irritation.  The patient now understands better.

## 2019-07-20 ENCOUNTER — Other Ambulatory Visit: Payer: Self-pay

## 2019-07-20 ENCOUNTER — Encounter: Payer: Self-pay | Admitting: Family Medicine

## 2019-07-20 ENCOUNTER — Ambulatory Visit (INDEPENDENT_AMBULATORY_CARE_PROVIDER_SITE_OTHER): Payer: BC Managed Care – PPO | Admitting: Family Medicine

## 2019-07-20 VITALS — BP 122/80 | HR 84 | Temp 97.2°F | Resp 14 | Ht 67.5 in | Wt 173.0 lb

## 2019-07-20 DIAGNOSIS — M7711 Lateral epicondylitis, right elbow: Secondary | ICD-10-CM | POA: Diagnosis not present

## 2019-07-20 NOTE — Progress Notes (Signed)
Subjective:    Patient ID: Charlene Scott, female    DOB: [birth year], 29 y.o.   MRN: [id number]  HPI Patient is a very pleasant 29 year old Caucasian female who presents today with 32-month history of right elbow pain.  The pain is located over the lateral epicondyle and slightly distal to the lateral epicondyle and the tendon of the extensor compartment.  Patient reports a burning pain in that area.  Is made worse by gripping objects and dorsiflexing the wrist.  She denies any specific injury or specific trigger.  She has tried ibuprofen.  She is also tried an elbow strap although she has only worn it occasionally and not consistently.  There is no erythema or swelling in that area.  There is no palpable deformity in the extensor muscle group.  She has normal grip strength in the hand and normal flexion and extension of the wrist as well as the MCP joints. Past Medical History:  Diagnosis Date  . Depression    no meds  . Heart murmur    as child, no problems    Past Surgical History:  Procedure Laterality Date  . KNEE SURGERY     right knee surgery at age 29 yrs old  . SVD  2010   x 1  . WISDOM TOOTH EXTRACTION     Current Outpatient Medications on File Prior to Visit  Medication Sig Dispense Refill  . levonorgestrel (MIRENA) 20 MCG/24HR IUD 1 each by Intrauterine route continuous.     Marland Kitchen LINZESS 145 MCG CAPS capsule TAKE 1 CAPSULE BY MOUTH EVERY DAY BEFORE BREAKFAST 30 capsule 5   No current facility-administered medications on file prior to visit.   No Known Allergies Social History   Socioeconomic History  . Marital status: Single    Spouse name: Not on file  . Number of children: Not on file  . Years of education: Not on file  . Highest education level: Not on file  Occupational History  . Not on file  Tobacco Use  . Smoking status: Former Smoker    Packs/day: 0.25    Years: 3.00    Pack years: 0.75    Types: Cigarettes    Quit date: 03/09/2011    Years since  quitting: 8.3  . Smokeless tobacco: Never Used  Substance and Sexual Activity  . Alcohol use: Yes    Alcohol/week: 0.0 standard drinks    Comment: Socially  . Drug use: No  . Sexual activity: Yes    Partners: Male    Birth control/protection: I.U.D.  Other Topics Concern  . Not on file  Social History Narrative  . Not on file   Social Determinants of Health   Financial Resource Strain:   . Difficulty of Paying Living Expenses:   Food Insecurity:   . Worried About Charity fundraiser in the Last Year:   . Arboriculturist in the Last Year:   Transportation Needs:   . Film/video editor (Medical):   Marland Kitchen Lack of Transportation (Non-Medical):   Physical Activity:   . Days of Exercise per Week:   . Minutes of Exercise per Session:   Stress:   . Feeling of Stress :   Social Connections:   . Frequency of Communication with Friends and Family:   . Frequency of Social Gatherings with Friends and Family:   . Attends Religious Services:   . Active Member of Clubs or Organizations:   . Attends Archivist Meetings:   .  Marital Status:   Intimate Partner Violence:   . Fear of Current or Ex-Partner:   . Emotionally Abused:   Marland Kitchen Physically Abused:   . Sexually Abused:       Review of Systems  All other systems reviewed and are negative.      Objective:   Physical Exam Vitals reviewed.  Cardiovascular:     Rate and Rhythm: Normal rate and regular rhythm.     Pulses: Normal pulses.     Heart sounds: Normal heart sounds.  Musculoskeletal:     Right elbow: No swelling, deformity, effusion or lacerations. Normal range of motion. Tenderness present in lateral epicondyle.           Assessment & Plan:  Lateral epicondylitis of right elbow  Patient has a lateral epicondylitis.  Patient has tried and failed conservative measures.  Therefore she elects to receive a cortisone injection.  Using sterile technique, I injected a mixture of 1 cc of lidocaine and 1 cc of  40 mg/mL Kenalog just distal to the lateral epicondyle adjacent to the extensor tendon sheath.  Patient tolerated the procedure well without complication.  Recommended that she wear an elbow strap for the next week or so until the inflammation subsides.  Recheck if no better in 1 week or sooner if worsening.

## 2019-09-14 ENCOUNTER — Other Ambulatory Visit: Payer: Self-pay | Admitting: Family Medicine

## 2019-09-18 IMAGING — NM NM HEPATO W/GB/PHARM/[PERSON_NAME]
2 series · 12 of 12 positions shown · non-contrast
Comparison: None

CLINICAL DATA: Intermittent abdominal pain and nausea for 10 years
progressively worsening for 2 years

EXAM:
NUCLEAR MEDICINE HEPATOBILIARY IMAGING WITH GALLBLADDER EF
TECHNIQUE: Sequential images of the abdomen were obtained [DATE] minutes
following intravenous administration of radiopharmaceutical. After
oral ingestion of Ensure, gallbladder ejection fraction was
determined. At 60 min, normal ejection fraction is greater than 33%.
RADIOPHARMACEUTICALS:  5.3 mCi Fc-NNm  Choletec IV

[Series 1: biliary · 3.25mm/px · 6 of 60 frames shown]
[frame 6/60]
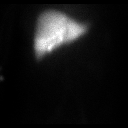
[frame 16/60]
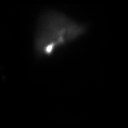
[frame 26/60]
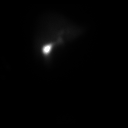
[frame 36/60]
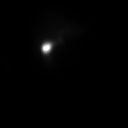
[frame 46/60]
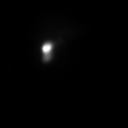
[frame 56/60]
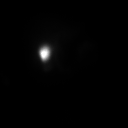

[Series 2: gbef · 3.25mm/px · 6 of 60 frames shown]
[frame 6/60]
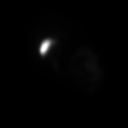
[frame 16/60]
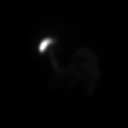
[frame 26/60]
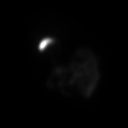
[frame 36/60]
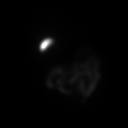
[frame 46/60]
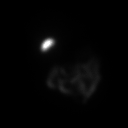
[frame 56/60]
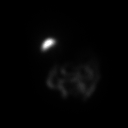

[12 of 12 positions shown; findings below may reference images not displayed]

FINDINGS: Normal tracer extraction from bloodstream indicating normal
hepatocellular function.

Normal excretion of tracer into biliary tree.

Gallbladder visualized at 10 min.

Small bowel visualized at 51 min.

No hepatic retention of tracer.

Subjectively normal emptying of tracer from gallbladder following
fatty meal stimulation.

Calculated gallbladder ejection fraction is 54%, normal.

Patient reported no symptoms following Ensure ingestion.

Normal gallbladder ejection fraction following Ensure ingestion is
greater than 33% at 1 hour.
IMPRESSION: Normal exam.

## 2020-02-10 ENCOUNTER — Other Ambulatory Visit: Payer: Self-pay | Admitting: Family Medicine

## 2020-02-22 ENCOUNTER — Other Ambulatory Visit: Payer: Self-pay

## 2020-02-22 ENCOUNTER — Ambulatory Visit (INDEPENDENT_AMBULATORY_CARE_PROVIDER_SITE_OTHER): Payer: BC Managed Care – PPO | Admitting: Family Medicine

## 2020-02-22 VITALS — BP 110/80 | HR 91 | Temp 98.5°F | Ht 67.0 in | Wt 184.0 lb

## 2020-02-22 DIAGNOSIS — Z0001 Encounter for general adult medical examination with abnormal findings: Secondary | ICD-10-CM | POA: Diagnosis not present

## 2020-02-22 DIAGNOSIS — Z0000 Encounter for general adult medical examination without abnormal findings: Secondary | ICD-10-CM

## 2020-02-22 DIAGNOSIS — F411 Generalized anxiety disorder: Secondary | ICD-10-CM | POA: Diagnosis not present

## 2020-02-22 LAB — CBC WITH DIFFERENTIAL/PLATELET
Absolute Monocytes: 632 cells/uL (ref 200–950)
Basophils Absolute: 20 cells/uL (ref 0–200)
Basophils Relative: 0.3 %
Eosinophils Absolute: 48 cells/uL (ref 15–500)
Eosinophils Relative: 0.7 %
HCT: 40.4 % (ref 35.0–45.0)
Hemoglobin: 13.7 g/dL (ref 11.7–15.5)
Lymphs Abs: 1979 cells/uL (ref 850–3900)
MCH: 31.2 pg (ref 27.0–33.0)
MCHC: 33.9 g/dL (ref 32.0–36.0)
MCV: 92 fL (ref 80.0–100.0)
MPV: 10.7 fL (ref 7.5–12.5)
Monocytes Relative: 9.3 %
Neutro Abs: 4121 cells/uL (ref 1500–7800)
Neutrophils Relative %: 60.6 %
Platelets: 222 10*3/uL (ref 140–400)
RBC: 4.39 10*6/uL (ref 3.80–5.10)
RDW: 12.9 % (ref 11.0–15.0)
Total Lymphocyte: 29.1 %
WBC: 6.8 10*3/uL (ref 3.8–10.8)

## 2020-02-22 LAB — COMPLETE METABOLIC PANEL WITH GFR
AG Ratio: 1.8 (calc) (ref 1.0–2.5)
ALT: 21 U/L (ref 6–29)
AST: 15 U/L (ref 10–30)
Albumin: 4.4 g/dL (ref 3.6–5.1)
Alkaline phosphatase (APISO): 56 U/L (ref 31–125)
BUN: 17 mg/dL (ref 7–25)
CO2: 25 mmol/L (ref 20–32)
Calcium: 8.7 mg/dL (ref 8.6–10.2)
Chloride: 106 mmol/L (ref 98–110)
Creat: 0.77 mg/dL (ref 0.50–1.10)
GFR, Est African American: 119 mL/min/{1.73_m2} (ref >=60)
GFR, Est Non African American: 103 mL/min/{1.73_m2} (ref >=60)
Globulin: 2.4 g/dL (calc) (ref 1.9–3.7)
Glucose, Bld: 116 mg/dL — ABNORMAL HIGH (ref 65–99)
Potassium: 4.1 mmol/L (ref 3.5–5.3)
Sodium: 138 mmol/L (ref 135–146)
Total Bilirubin: 0.5 mg/dL (ref 0.2–1.2)
Total Protein: 6.8 g/dL (ref 6.1–8.1)

## 2020-02-22 LAB — LIPID PANEL
Cholesterol: 125 mg/dL (ref <200)
HDL: 48 mg/dL — ABNORMAL LOW (ref >=50)
LDL Cholesterol (Calc): 67 mg/dL (calc)
Non-HDL Cholesterol (Calc): 77 mg/dL (calc) (ref <130)
Total CHOL/HDL Ratio: 2.6 (calc) (ref <5.0)
Triglycerides: 36 mg/dL (ref <150)

## 2020-02-22 MED ORDER — ESCITALOPRAM OXALATE 10 MG PO TABS: 10.0000 mg | ORAL_TABLET | Freq: Every day | ORAL | 5 refills | Status: DC

## 2020-02-22 MED ORDER — ALPRAZOLAM 0.5 MG PO TABS: 0.5000 mg | ORAL_TABLET | Freq: Three times a day (TID) | ORAL | 0 refills | Status: DC | PRN

## 2020-02-22 NOTE — Progress Notes (Signed)
Subjective:    Patient ID: Charlene Scott, female    DOB: [birth year], 29 y.o.   MRN: [id number]  HPI Patient originally made the appointment for a physical.  She declines a flu shot today.  She is already had Covid in October.  She declines the Covid vaccine.  Her Pap smear was performed by her gynecologist.  She is not yet due for mammogram.  She is due for fasting lab work.  However she is primarily concerned about stress and anxiety.  Over the last 2 years she has lost several family members.  Some of been to tragic accidents.  1 was to age-related medical problems.  1 was to an unintentional overdose.  There is also been amended stress at work.  As a result she feels anxious on a daily basis.  She is having panic attacks occasionally.  She denies any depression or anhedonia but she does feel overwhelmed most days with stress and anxiety.  She would like to try something to help manage the stress more effectively and control the panic attacks without being habit-forming Past Medical History:  Diagnosis Date  . Anxiety    [employer] 02/21/2020  . Depression    no meds  . Heart murmur    as child, no problems     Past Surgical History:  Procedure Laterality Date  . KNEE SURGERY     right knee surgery at age 23 yrs old  . SVD  2010   x 1  . WISDOM TOOTH EXTRACTION     Current Outpatient Medications on File Prior to Visit  Medication Sig Dispense Refill  . levonorgestrel (MIRENA) 20 MCG/24HR IUD 1 each by Intrauterine route continuous.     Marland Kitchen LINZESS 145 MCG CAPS capsule TAKE 1 CAPSULE BY MOUTH EVERY DAY BEFORE BREAKFAST 90 capsule 1   No current facility-administered medications on file prior to visit.   No Known Allergies Social History   Socioeconomic History  . Marital status: Single    Spouse name: Not on file  . Number of children: Not on file  . Years of education: Not on file  . Highest education level: Not on file  Occupational History  . Not on file  Tobacco Use  .  Smoking status: Former Smoker    Packs/day: 0.25    Years: 3.00    Pack years: 0.75    Types: Cigarettes    Quit date: 03/09/2011    Years since quitting: 8.9  . Smokeless tobacco: Never Used  Substance and Sexual Activity  . Alcohol use: Yes    Alcohol/week: 0.0 standard drinks    Comment: Socially  . Drug use: No  . Sexual activity: Yes    Partners: Male    Birth control/protection: I.U.D.  Other Topics Concern  . Not on file  Social History Narrative  . Not on file   Social Determinants of Health   Financial Resource Strain: Not on file  Food Insecurity: Not on file  Transportation Needs: Not on file  Physical Activity: Not on file  Stress: Not on file  Social Connections: Not on file  Intimate Partner Violence: Not on file   Family History  Problem Relation Age of Onset  . COPD Mother   . Diabetes Mother   . Vision loss Mother   . Diabetes Father   . Hypertension Father   . Diabetes Maternal Grandmother   . Heart disease Maternal Grandmother   . Hypertension Maternal Grandmother   . Hypertension  Maternal Grandfather   . Arthritis Paternal Grandmother   . Cancer Paternal Grandmother   . Stroke Paternal Grandmother   . Arthritis Paternal Grandfather       Review of Systems  All other systems reviewed and are negative.      Objective:   Physical Exam Vitals reviewed.  Constitutional:      General: She is not in acute distress.    Appearance: She is well-developed. She is not diaphoretic.  HENT:     Head: Normocephalic and atraumatic.     Right Ear: External ear normal.     Left Ear: External ear normal.     Nose: Nose normal.     Mouth/Throat:     Pharynx: No oropharyngeal exudate.  Eyes:     General: No scleral icterus.       Right eye: No discharge.        Left eye: No discharge.     Conjunctiva/sclera: Conjunctivae normal.     Pupils: Pupils are equal, round, and reactive to light.  Neck:     Thyroid: No thyromegaly.     Vascular: No  JVD.     Trachea: No tracheal deviation.  Cardiovascular:     Rate and Rhythm: Normal rate and regular rhythm.     Heart sounds: Normal heart sounds. No murmur heard. No friction rub. No gallop.   Pulmonary:     Effort: Pulmonary effort is normal. No respiratory distress.     Breath sounds: Normal breath sounds. No stridor. No wheezing or rales.  Chest:     Chest wall: No tenderness.  Abdominal:     General: Bowel sounds are normal. There is no distension.     Palpations: Abdomen is soft. There is no mass.     Tenderness: There is no abdominal tenderness. There is no guarding or rebound.  Musculoskeletal:        General: No tenderness. Normal range of motion.     Cervical back: Normal range of motion and neck supple.  Lymphadenopathy:     Cervical: No cervical adenopathy.  Skin:    General: Skin is warm.     Coloration: Skin is not pale.     Findings: No erythema or rash.  Neurological:     Mental Status: She is alert and oriented to person, place, and time.     Cranial Nerves: No cranial nerve deficit.     Motor: No abnormal muscle tone.     Coordination: Coordination normal.     Deep Tendon Reflexes: Reflexes are normal and symmetric.  Psychiatric:        Behavior: Behavior normal.        Thought Content: Thought content normal.        Judgment: Judgment normal.           Assessment & Plan:   General medical exam - Plan: CBC with Differential/Platelet, COMPLETE METABOLIC PANEL WITH GFR, Lipid panel  GAD (generalized anxiety disorder)  Physical exam today is completely normal.  Cancer screening is up-to-date.  Check CBC, CMP, lipid panel.  Recommended flu shot.  Recommended Covid shot but she politely declined.  Start the patient on Lexapro 10 mg a day to try to help better manage and control the anxiety.  She can use Xanax 0.5 mg p.o. 3 times daily sparingly as needed for anxiety and to the Lexapro takes full effect.  Reassess in 4  weeks

## 2020-02-27 ENCOUNTER — Encounter: Payer: Self-pay | Admitting: Family Medicine

## 2020-03-18 ENCOUNTER — Other Ambulatory Visit: Payer: Self-pay | Admitting: Family Medicine

## 2020-05-22 ENCOUNTER — Encounter: Payer: Self-pay | Admitting: Family Medicine

## 2020-05-22 ENCOUNTER — Ambulatory Visit (INDEPENDENT_AMBULATORY_CARE_PROVIDER_SITE_OTHER): Payer: BC Managed Care – PPO | Admitting: Family Medicine

## 2020-05-22 ENCOUNTER — Other Ambulatory Visit: Payer: Self-pay

## 2020-05-22 VITALS — BP 118/64 | HR 68 | Temp 98.1°F | Resp 16 | Ht 67.0 in | Wt 186.0 lb

## 2020-05-22 DIAGNOSIS — R7301 Impaired fasting glucose: Secondary | ICD-10-CM | POA: Diagnosis not present

## 2020-05-22 NOTE — Progress Notes (Signed)
Subjective:    Patient ID: Charlene Scott, female    DOB: 12-Apr-1990, 30 y.o.   MRN: [id number]  HPI Patient is a very sweet 30 year old Caucasian female here today to follow-up her last lab result.  At her physical exam, her blood sugar was elevated at 116.  This was a fasting sample and consistent with prediabetes.  Although her weight is the same roughly as her last visit, she has tried to change her diet.  She states that she is given up bread and starchy carbs as much as possible and has tried to avoid sugary beverages.  She admits that she is not exercising as much as she could.  Her family history is concerning for her father and her mother having diabetes.  She denies any polyuria, polydipsia, or blurry vision.  Unfortunately after her last visit her grandmother passed away.  However she has switched to a less stressful job and therefore she feels that her stress is doing better.  As result she is not taking Lexapro and does not feel that she needs it. Past Medical History:  Diagnosis Date  . Anxiety    [employer] 02/21/2020  . Depression    no meds  . Heart murmur    as child, no problems     Past Surgical History:  Procedure Laterality Date  . KNEE SURGERY     right knee surgery at age 2 yrs old  . SVD  2010   x 1  . WISDOM TOOTH EXTRACTION     Current Outpatient Medications on File Prior to Visit  Medication Sig Dispense Refill  . levonorgestrel (MIRENA) 20 MCG/24HR IUD 1 each by Intrauterine route continuous.     Marland Kitchen LINZESS 145 MCG CAPS capsule TAKE 1 CAPSULE BY MOUTH EVERY DAY BEFORE BREAKFAST 90 capsule 1  . ALPRAZolam (XANAX) 0.5 MG tablet Take 1 tablet (0.5 mg total) by mouth 3 (three) times daily as needed. (Patient not taking: Reported on 05/22/2020) 30 tablet 0  . escitalopram (LEXAPRO) 10 MG tablet TAKE 1 TABLET BY MOUTH EVERY DAY (Patient not taking: Reported on 05/22/2020) 90 tablet 2   No current facility-administered medications on file prior to visit.   No Known  Allergies Social History   Socioeconomic History  . Marital status: Single    Spouse name: Not on file  . Number of children: Not on file  . Years of education: Not on file  . Highest education level: Not on file  Occupational History  . Not on file  Tobacco Use  . Smoking status: Former Smoker    Packs/day: 0.25    Years: 3.00    Pack years: 0.75    Types: Cigarettes    Quit date: 03/09/2011    Years since quitting: 9.2  . Smokeless tobacco: Never Used  Substance and Sexual Activity  . Alcohol use: Yes    Alcohol/week: 0.0 standard drinks    Comment: Socially  . Drug use: No  . Sexual activity: Yes    Partners: Male    Birth control/protection: I.U.D.  Other Topics Concern  . Not on file  Social History Narrative  . Not on file   Social Determinants of Health   Financial Resource Strain: Not on file  Food Insecurity: Not on file  Transportation Needs: Not on file  Physical Activity: Not on file  Stress: Not on file  Social Connections: Not on file  Intimate Partner Violence: Not on file   Family History  Problem Relation  Age of Onset  . COPD Mother   . Diabetes Mother   . Vision loss Mother   . Diabetes Father   . Hypertension Father   . Diabetes Maternal Grandmother   . Heart disease Maternal Grandmother   . Hypertension Maternal Grandmother   . Hypertension Maternal Grandfather   . Arthritis Paternal Grandmother   . Cancer Paternal Grandmother   . Stroke Paternal Grandmother   . Arthritis Paternal Grandfather       Review of Systems  All other systems reviewed and are negative.      Objective:   Physical Exam Vitals reviewed.  Constitutional:      General: She is not in acute distress.    Appearance: She is well-developed. She is not diaphoretic.  HENT:     Head: Normocephalic and atraumatic.     Right Ear: External ear normal.     Left Ear: External ear normal.     Nose: Nose normal.     Mouth/Throat:     Pharynx: No oropharyngeal  exudate.  Eyes:     General: No scleral icterus.       Right eye: No discharge.        Left eye: No discharge.     Conjunctiva/sclera: Conjunctivae normal.     Pupils: Pupils are equal, round, and reactive to light.  Neck:     Thyroid: No thyromegaly.     Vascular: No JVD.     Trachea: No tracheal deviation.  Cardiovascular:     Rate and Rhythm: Normal rate and regular rhythm.     Heart sounds: Normal heart sounds. No murmur heard. No friction rub. No gallop.   Pulmonary:     Effort: Pulmonary effort is normal. No respiratory distress.     Breath sounds: Normal breath sounds. No stridor. No wheezing or rales.  Chest:     Chest wall: No tenderness.  Abdominal:     General: Bowel sounds are normal. There is no distension.     Palpations: Abdomen is soft. There is no mass.     Tenderness: There is no abdominal tenderness. There is no guarding or rebound.  Musculoskeletal:        General: No tenderness. Normal range of motion.     Cervical back: Normal range of motion and neck supple.  Lymphadenopathy:     Cervical: No cervical adenopathy.  Skin:    General: Skin is warm.     Coloration: Skin is not pale.     Findings: No erythema or rash.  Neurological:     Mental Status: She is alert and oriented to person, place, and time.     Cranial Nerves: No cranial nerve deficit.     Motor: No abnormal muscle tone.     Coordination: Coordination normal.     Deep Tendon Reflexes: Reflexes are normal and symmetric.  Psychiatric:        Behavior: Behavior normal.        Thought Content: Thought content normal.        Judgment: Judgment normal.           Assessment & Plan:   Elevated fasting blood sugar - Plan: Hemoglobin O9G, BASIC METABOLIC PANEL WITH GFR I am glad that the patient's anxiety and stress is better.  I am concerned about her elevated sugar particular given her family history.  Therefore we will check an A1c.  I also recommended a low carbohydrate diet and at least  30 minutes a day  5 days a week of aerobic exercise.  Ideally I had like to see her BMI closer to 25.  Try to lose 10 to 15 pounds gradually if possible.  Await the results of her lab work.

## 2020-05-23 LAB — BASIC METABOLIC PANEL WITH GFR
BUN: 14 mg/dL (ref 7–25)
CO2: 25 mmol/L (ref 20–32)
Calcium: 9.8 mg/dL (ref 8.6–10.2)
Chloride: 103 mmol/L (ref 98–110)
Creat: 0.81 mg/dL (ref 0.50–1.10)
GFR, Est African American: 112 mL/min/{1.73_m2} (ref >=60)
GFR, Est Non African American: 97 mL/min/{1.73_m2} (ref >=60)
Glucose, Bld: 91 mg/dL (ref 65–99)
Potassium: 4.1 mmol/L (ref 3.5–5.3)
Sodium: 138 mmol/L (ref 135–146)

## 2020-05-23 LAB — HEMOGLOBIN A1C
Hgb A1c MFr Bld: 5.5 % of total Hgb (ref <5.7)
Mean Plasma Glucose: 111 mg/dL
eAG (mmol/L): 6.2 mmol/L

## 2020-05-28 ENCOUNTER — Encounter: Payer: Self-pay | Admitting: Family Medicine

## 2020-06-02 ENCOUNTER — Encounter: Payer: Self-pay | Admitting: Family Medicine

## 2020-06-09 ENCOUNTER — Encounter: Payer: Self-pay | Admitting: Family Medicine

## 2020-06-10 ENCOUNTER — Other Ambulatory Visit: Payer: Self-pay | Admitting: Family Medicine

## 2020-06-10 MED ORDER — FLUTICASONE PROPIONATE 50 MCG/ACT NA SUSP: 2.0000 | Freq: Every day | NASAL | 6 refills | Status: DC

## 2020-06-10 MED ORDER — LEVOCETIRIZINE DIHYDROCHLORIDE 5 MG PO TABS: 5.0000 mg | ORAL_TABLET | Freq: Every evening | ORAL | 0 refills | Status: DC

## 2020-07-02 ENCOUNTER — Other Ambulatory Visit: Payer: Self-pay | Admitting: Family Medicine

## 2020-08-12 ENCOUNTER — Encounter: Payer: Self-pay | Admitting: Family Medicine

## 2020-08-12 DIAGNOSIS — K648 Other hemorrhoids: Secondary | ICD-10-CM

## 2020-08-12 DIAGNOSIS — Z1211 Encounter for screening for malignant neoplasm of colon: Secondary | ICD-10-CM

## 2020-08-21 ENCOUNTER — Other Ambulatory Visit: Payer: Self-pay | Admitting: Family Medicine

## 2020-08-21 MED ORDER — ALPRAZOLAM 0.5 MG PO TABS: 0.5000 mg | ORAL_TABLET | Freq: Three times a day (TID) | ORAL | 0 refills | Status: DC | PRN

## 2020-08-21 NOTE — Telephone Encounter (Signed)
PDMP reviewed.  Appears patient takes this medication sporadically for anxiety.  Refill given in place of PCP who is out of office.

## 2020-09-02 ENCOUNTER — Other Ambulatory Visit: Payer: Self-pay

## 2020-09-02 MED ORDER — LINACLOTIDE 145 MCG PO CAPS: ORAL_CAPSULE | ORAL | 1 refills | Status: DC

## 2020-09-30 ENCOUNTER — Other Ambulatory Visit: Payer: Self-pay | Admitting: Nurse Practitioner

## 2020-09-30 MED ORDER — ALPRAZOLAM 0.5 MG PO TABS: 0.5000 mg | ORAL_TABLET | Freq: Three times a day (TID) | ORAL | 3 refills | Status: DC | PRN

## 2020-09-30 NOTE — Telephone Encounter (Signed)
Ok to refill??  Last office visit 08/12/2020.  Last refill 08/21/2020.  Ok to add refills to prescription?

## 2021-02-02 ENCOUNTER — Other Ambulatory Visit: Payer: Self-pay | Admitting: Obstetrics and Gynecology

## 2021-02-02 DIAGNOSIS — N644 Mastodynia: Secondary | ICD-10-CM

## 2021-03-02 ENCOUNTER — Other Ambulatory Visit: Payer: Self-pay | Admitting: Obstetrics and Gynecology

## 2021-03-02 DIAGNOSIS — N644 Mastodynia: Secondary | ICD-10-CM

## 2021-03-03 ENCOUNTER — Other Ambulatory Visit: Payer: Self-pay

## 2021-03-04 ENCOUNTER — Other Ambulatory Visit: Payer: Self-pay

## 2021-03-12 ENCOUNTER — Other Ambulatory Visit: Payer: Self-pay | Admitting: Obstetrics and Gynecology

## 2021-03-12 ENCOUNTER — Ambulatory Visit
Admission: RE | Admit: 2021-03-12 | Discharge: 2021-03-12 | Disposition: A | Discharge status: Home / Self Care | Payer: BC Managed Care – PPO | Source: Ambulatory Visit | Attending: Obstetrics and Gynecology | Admitting: Obstetrics and Gynecology

## 2021-03-12 DIAGNOSIS — N6320 Unspecified lump in the left breast, unspecified quadrant: Secondary | ICD-10-CM

## 2021-03-12 DIAGNOSIS — N644 Mastodynia: Secondary | ICD-10-CM

## 2021-04-30 ENCOUNTER — Other Ambulatory Visit: Payer: Self-pay

## 2021-04-30 ENCOUNTER — Ambulatory Visit (INDEPENDENT_AMBULATORY_CARE_PROVIDER_SITE_OTHER): Payer: BC Managed Care – PPO | Admitting: Family Medicine

## 2021-04-30 ENCOUNTER — Encounter: Payer: Self-pay | Admitting: Family Medicine

## 2021-04-30 ENCOUNTER — Ambulatory Visit: Payer: BC Managed Care – PPO | Admitting: Family Medicine

## 2021-04-30 VITALS — BP 118/78 | HR 86 | Temp 97.7°F | Resp 18 | Ht 67.0 in | Wt 184.0 lb

## 2021-04-30 DIAGNOSIS — Z0000 Encounter for general adult medical examination without abnormal findings: Secondary | ICD-10-CM

## 2021-04-30 DIAGNOSIS — E663 Overweight: Secondary | ICD-10-CM | POA: Insufficient documentation

## 2021-04-30 MED ORDER — ALPRAZOLAM 0.5 MG PO TABS: 0.5000 mg | ORAL_TABLET | Freq: Three times a day (TID) | ORAL | 3 refills | Status: DC | PRN

## 2021-04-30 NOTE — Progress Notes (Signed)
Subjective:    Patient ID: Charlene Scott, female    DOB: 31-Jan-1991, 31 y.o.   MRN: [id number]  HPI Patient is a very pleasant 31 year old Caucasian female who is here today for complete physical exam.  She sees her gynecologist for her Pap smears.  These are up-to-date.  She did have an abnormal mammogram.  They believe it is a benign growth in her left breast.  They recommended a repeat mammogram in 6 months.  She already has a scheduled.  She is not yet due for colonoscopy.  Otherwise she is doing well.  She does have a small lump growing on her right temple.  It is roughly 3 to 4 mm in diameter.  It is soft and freely mobile.  Is a subcutaneous mass.  I believe this most likely either a small sebaceous cyst.  Her tetanus shot is up-to-date.  She politely declines her flu shot. Past Medical History:  Diagnosis Date   Anxiety    [employer]reesia 02/21/2020   Depression    no meds   Heart murmur    as child, no problems     Past Surgical History:  Procedure Laterality Date   KNEE SURGERY     right knee surgery at age 31 yrs old   SVD  2010   x 1   WISDOM TOOTH EXTRACTION     Current Outpatient Medications on File Prior to Visit  Medication Sig Dispense Refill   fluticasone (FLONASE) 50 MCG/ACT nasal spray Place 2 sprays into both nostrils daily. 16 g 6   levonorgestrel (MIRENA) 20 MCG/24HR IUD 1 each by Intrauterine route continuous.      linaclotide (LINZESS) 145 MCG CAPS capsule TAKE 1 CAPSULE BY MOUTH EVERY DAY BEFORE BREAKFAST 90 capsule 1   levocetirizine (XYZAL) 5 MG tablet TAKE 1 TABLET BY MOUTH EVERY DAY IN THE EVENING (Patient not taking: Reported on 04/30/2021) 90 tablet 3   No current facility-administered medications on file prior to visit.   No Known Allergies Social History   Socioeconomic History   Marital status: Single    Spouse name: Not on file   Number of children: Not on file   Years of education: Not on file   Highest education level: Not on file  Occupational  History   Not on file  Tobacco Use   Smoking status: Former    Packs/day: 0.25    Years: 3.00    Pack years: 0.75    Types: Cigarettes    Quit date: 03/09/2011    Years since quitting: 10.1   Smokeless tobacco: Never  Substance and Sexual Activity   Alcohol use: Yes    Alcohol/week: 0.0 standard drinks    Comment: Socially   Drug use: No   Sexual activity: Yes    Partners: Male    Birth control/protection: I.U.D.  Other Topics Concern   Not on file  Social History Narrative   Not on file   Social Determinants of Health   Financial Resource Strain: Not on file  Food Insecurity: Not on file  Transportation Needs: Not on file  Physical Activity: Not on file  Stress: Not on file  Social Connections: Not on file  Intimate Partner Violence: Not on file   Family History  Problem Relation Age of Onset   COPD Mother    Diabetes Mother    Vision loss Mother    Diabetes Father    Hypertension Father    Breast cancer Maternal Aunt  Diabetes Maternal Grandmother    Heart disease Maternal Grandmother    Hypertension Maternal Grandmother    Hypertension Maternal Grandfather    Arthritis Paternal Grandmother    Cancer Paternal Grandmother    Stroke Paternal Grandmother    Arthritis Paternal Grandfather       Review of Systems  All other systems reviewed and are negative.     Objective:   Physical Exam Vitals reviewed.  Constitutional:      General: She is not in acute distress.    Appearance: She is well-developed. She is not diaphoretic.  HENT:     Head: Normocephalic and atraumatic.      Right Ear: Tympanic membrane, ear canal and external ear normal.     Left Ear: Tympanic membrane, ear canal and external ear normal.     Nose: Nose normal.     Mouth/Throat:     Pharynx: No oropharyngeal exudate.  Eyes:     General: No scleral icterus.       Right eye: No discharge.        Left eye: No discharge.     Conjunctiva/sclera: Conjunctivae normal.     Pupils:  Pupils are equal, round, and reactive to light.  Neck:     Thyroid: No thyromegaly.     Vascular: No carotid bruit or JVD.     Trachea: No tracheal deviation.  Cardiovascular:     Rate and Rhythm: Normal rate and regular rhythm.     Heart sounds: Normal heart sounds. No murmur heard.   No friction rub. No gallop.  Pulmonary:     Effort: Pulmonary effort is normal. No respiratory distress.     Breath sounds: Normal breath sounds. No stridor. No wheezing or rales.  Chest:     Chest wall: No tenderness.  Abdominal:     General: Bowel sounds are normal. There is no distension.     Palpations: Abdomen is soft. There is no mass.     Tenderness: There is no abdominal tenderness. There is no guarding or rebound.  Musculoskeletal:        General: No tenderness. Normal range of motion.     Cervical back: Normal range of motion and neck supple. No tenderness.  Lymphadenopathy:     Cervical: No cervical adenopathy.  Skin:    General: Skin is warm.     Coloration: Skin is not jaundiced or pale.     Findings: No bruising, erythema, lesion or rash.  Neurological:     General: No focal deficit present.     Mental Status: She is alert and oriented to person, place, and time. Mental status is at baseline.     Cranial Nerves: No cranial nerve deficit.     Sensory: No sensory deficit.     Motor: No weakness or abnormal muscle tone.     Coordination: Coordination normal.     Gait: Gait normal.     Deep Tendon Reflexes: Reflexes are normal and symmetric. Reflexes normal.  Psychiatric:        Behavior: Behavior normal.        Thought Content: Thought content normal.        Judgment: Judgment normal.          Assessment & Plan:   General medical exam - Plan: CBC with Differential/Platelet, COMPLETE METABOLIC PANEL WITH GFR, Lipid panel Patient's physical exam today is normal.  Her blood pressure is excellent.  She already has her follow-up diagnostic mammogram scheduled for 6 months  but I do  believe it is a benign lesion.  Pap smear is up-to-date.  Check CBC CMP and lipid panel.  I did give her samples of Linzess 72 micrograms daily.  She states that the 145 mcg is occasionally too strong.  She would like to see if this works better.  Also refill her Xanax that she uses sparingly as needed for anxiety.  There is no evidence of abuse or diversion.

## 2021-05-01 LAB — CBC WITH DIFFERENTIAL/PLATELET
Absolute Monocytes: 813 cells/uL (ref 200–950)
Basophils Absolute: 39 cells/uL (ref 0–200)
Basophils Relative: 0.4 %
Eosinophils Absolute: 69 cells/uL (ref 15–500)
Eosinophils Relative: 0.7 %
HCT: 39.9 % (ref 35.0–45.0)
Hemoglobin: 13.3 g/dL (ref 11.7–15.5)
Lymphs Abs: 2832 cells/uL (ref 850–3900)
MCH: 30.6 pg (ref 27.0–33.0)
MCHC: 33.3 g/dL (ref 32.0–36.0)
MCV: 91.9 fL (ref 80.0–100.0)
MPV: 10.6 fL (ref 7.5–12.5)
Monocytes Relative: 8.3 %
Neutro Abs: 6047 cells/uL (ref 1500–7800)
Neutrophils Relative %: 61.7 %
Platelets: 219 10*3/uL (ref 140–400)
RBC: 4.34 10*6/uL (ref 3.80–5.10)
RDW: 13 % (ref 11.0–15.0)
Total Lymphocyte: 28.9 %
WBC: 9.8 10*3/uL (ref 3.8–10.8)

## 2021-05-01 LAB — COMPLETE METABOLIC PANEL WITH GFR
AG Ratio: 1.8 (calc) (ref 1.0–2.5)
ALT: 19 U/L (ref 6–29)
AST: 14 U/L (ref 10–30)
Albumin: 4.6 g/dL (ref 3.6–5.1)
Alkaline phosphatase (APISO): 62 U/L (ref 31–125)
BUN: 12 mg/dL (ref 7–25)
CO2: 28 mmol/L (ref 20–32)
Calcium: 9.3 mg/dL (ref 8.6–10.2)
Chloride: 103 mmol/L (ref 98–110)
Creat: 0.71 mg/dL (ref 0.50–0.97)
Globulin: 2.5 g/dL (calc) (ref 1.9–3.7)
Glucose, Bld: 86 mg/dL (ref 65–99)
Potassium: 3.9 mmol/L (ref 3.5–5.3)
Sodium: 140 mmol/L (ref 135–146)
Total Bilirubin: 0.3 mg/dL (ref 0.2–1.2)
Total Protein: 7.1 g/dL (ref 6.1–8.1)
eGFR: 116 mL/min/{1.73_m2} (ref >=60)

## 2021-05-01 LAB — LIPID PANEL
Cholesterol: 134 mg/dL (ref <200)
HDL: 47 mg/dL — ABNORMAL LOW (ref >=50)
LDL Cholesterol (Calc): 65 mg/dL (calc)
Non-HDL Cholesterol (Calc): 87 mg/dL (calc) (ref <130)
Total CHOL/HDL Ratio: 2.9 (calc) (ref <5.0)
Triglycerides: 135 mg/dL (ref <150)

## 2021-05-04 ENCOUNTER — Encounter: Payer: Self-pay | Admitting: Family Medicine

## 2021-05-04 ENCOUNTER — Other Ambulatory Visit: Payer: Self-pay | Admitting: Family Medicine

## 2021-05-04 MED ORDER — PHENTERMINE HCL 37.5 MG PO TABS: 37.5000 mg | ORAL_TABLET | Freq: Every day | ORAL | 2 refills | Status: DC

## 2021-06-25 ENCOUNTER — Encounter: Payer: Self-pay | Admitting: Family Medicine

## 2021-06-25 ENCOUNTER — Other Ambulatory Visit: Payer: Self-pay | Admitting: Family Medicine

## 2021-06-25 MED ORDER — PREDNISONE 20 MG PO TABS: ORAL_TABLET | ORAL | 0 refills | Status: DC

## 2021-06-25 NOTE — Telephone Encounter (Signed)
Rx sent in per Dr. Dennard Schaumann ?

## 2021-07-03 ENCOUNTER — Telehealth: Payer: Self-pay

## 2021-07-03 MED ORDER — LINACLOTIDE 145 MCG PO CAPS: ORAL_CAPSULE | ORAL | 1 refills | Status: DC

## 2021-07-03 NOTE — Telephone Encounter (Signed)
Rx sent to pharmacy   

## 2021-07-03 NOTE — Telephone Encounter (Signed)
Pharmacy faxed a refill request for  ? ?linaclotide (LINZESS) 145 MCG CAPS capsule [[id number]]  ?  Order Details ?Dose, Route, Frequency: As Directed  ?Dispense Quantity: 90 capsule Refills: 1   ?     ?Sig: TAKE 1 CAPSULE BY MOUTH EVERY DAY BEFORE BREAKFAST  ?     ?Start Date: 09/02/20 End Date: --  ?Written Date: 09/02/20 Expiration Date: 09/02/21  ?Original OrderRolan Lipa 145 MCG CAPS capsule [[id number]]  ? ?

## 2021-08-06 ENCOUNTER — Encounter: Payer: Self-pay | Admitting: Family Medicine

## 2021-08-07 ENCOUNTER — Other Ambulatory Visit: Payer: Self-pay | Admitting: Family Medicine

## 2021-08-07 DIAGNOSIS — K649 Unspecified hemorrhoids: Secondary | ICD-10-CM

## 2021-09-11 ENCOUNTER — Other Ambulatory Visit: Payer: Self-pay | Admitting: Obstetrics and Gynecology

## 2021-09-11 ENCOUNTER — Ambulatory Visit
Admission: RE | Admit: 2021-09-11 | Discharge: 2021-09-11 | Disposition: A | Discharge status: Home / Self Care | Payer: BC Managed Care – PPO | Source: Ambulatory Visit | Attending: Obstetrics and Gynecology | Admitting: Obstetrics and Gynecology

## 2021-09-11 DIAGNOSIS — N6320 Unspecified lump in the left breast, unspecified quadrant: Secondary | ICD-10-CM

## 2021-10-24 ENCOUNTER — Encounter: Payer: Self-pay | Admitting: Family Medicine

## 2021-11-24 ENCOUNTER — Telehealth: Payer: Self-pay | Admitting: Gastroenterology

## 2021-11-24 NOTE — Telephone Encounter (Signed)
Hi Dr. Candis Schatz,  [facility] provider  We received a referral for patient to be evaluated for hemorrhoids. She has GI history with Dr. Collene Mares but due to insurance she was not able to continue care has been getting treatment with PCP. Her records are available through [facility] fr you to review and advise on scheduling.    Thanks

## 2021-12-04 ENCOUNTER — Encounter: Payer: Self-pay | Admitting: Gastroenterology

## 2021-12-31 ENCOUNTER — Ambulatory Visit: Payer: BC Managed Care – PPO | Admitting: Family Medicine

## 2021-12-31 VITALS — BP 118/82 | HR 122 | Ht 67.0 in | Wt 180.0 lb

## 2021-12-31 DIAGNOSIS — M25512 Pain in left shoulder: Secondary | ICD-10-CM

## 2021-12-31 DIAGNOSIS — F410 Panic disorder [episodic paroxysmal anxiety] without agoraphobia: Secondary | ICD-10-CM

## 2021-12-31 MED ORDER — ALPRAZOLAM 0.5 MG PO TABS: 0.5000 mg | ORAL_TABLET | Freq: Three times a day (TID) | ORAL | 3 refills | Status: DC | PRN

## 2021-12-31 NOTE — Progress Notes (Signed)
Subjective:    Patient ID: Charlene Scott, female    DOB: 12-06-1990, 31 y.o.   MRN: [id number]  Shoulder Pain   Patient presents today with 3 to 4 months of left shoulder pain.  She reports pain with abduction greater than 100 degrees.  She reports an aching pain keeps her from sleeping at night.  She has a positive Hawkins sign.  She has pain with empty can testing.  She has a negative drop sign.  There is no crepitus on range of motion.  She has negative Spurling's test.  She has good grip strength and no numbness or tingling in her arms.  She also reports increased panic attacks.  Unfortunately her father recently died suddenly from a heart attack.  Since that time she had 2 or 3 panic attacks per week.  She had to use an old prescription of Xanax which did help when she took it.  This been going on for the last 3 weeks. Past Medical History:  Diagnosis Date   Anxiety    [employer]reesia 02/21/2020   Depression    no meds   Heart murmur    as child, no problems     Past Surgical History:  Procedure Laterality Date   KNEE SURGERY     right knee surgery at age 83 yrs old   SVD  2010   x 1   WISDOM TOOTH EXTRACTION     Current Outpatient Medications on File Prior to Visit  Medication Sig Dispense Refill   fluticasone (FLONASE) 50 MCG/ACT nasal spray Place 2 sprays into both nostrils daily. 16 g 6   levonorgestrel (MIRENA) 20 MCG/24HR IUD 1 each by Intrauterine route continuous.      linaclotide (LINZESS) 145 MCG CAPS capsule TAKE 1 CAPSULE BY MOUTH EVERY DAY BEFORE BREAKFAST 90 capsule 1   No current facility-administered medications on file prior to visit.   No Known Allergies Social History   Socioeconomic History   Marital status: Single    Spouse name: Not on file   Number of children: Not on file   Years of education: Not on file   Highest education level: Not on file  Occupational History   Not on file  Tobacco Use   Smoking status: Former    Packs/day: 0.25    Years:  3.00    Total pack years: 0.75    Types: Cigarettes    Quit date: 03/09/2011    Years since quitting: 10.8   Smokeless tobacco: Never  Substance and Sexual Activity   Alcohol use: Yes    Alcohol/week: 0.0 standard drinks of alcohol    Comment: Socially   Drug use: No   Sexual activity: Yes    Partners: Male    Birth control/protection: I.U.D.  Other Topics Concern   Not on file  Social History Narrative   Not on file   Social Determinants of Health   Financial Resource Strain: Not on file  Food Insecurity: Not on file  Transportation Needs: Not on file  Physical Activity: Not on file  Stress: Not on file  Social Connections: Not on file  Intimate Partner Violence: Not on file   Family History  Problem Relation Age of Onset   COPD Mother    Diabetes Mother    Vision loss Mother    Diabetes Father    Hypertension Father    Breast cancer Maternal Aunt    Diabetes Maternal Grandmother    Heart disease Maternal Grandmother  Hypertension Maternal Grandmother    Hypertension Maternal Grandfather    Arthritis Paternal Grandmother    Cancer Paternal Grandmother    Stroke Paternal Grandmother    Arthritis Paternal Grandfather       Review of Systems  All other systems reviewed and are negative.      Objective:   Physical Exam Vitals reviewed.  Constitutional:      General: She is not in acute distress.    Appearance: She is well-developed. She is not diaphoretic.  HENT:     Head: Normocephalic and atraumatic.     Right Ear: External ear normal.     Left Ear: External ear normal.     Nose: Nose normal.     Mouth/Throat:     Pharynx: No oropharyngeal exudate.  Eyes:     General: No scleral icterus.       Right eye: No discharge.        Left eye: No discharge.     Conjunctiva/sclera: Conjunctivae normal.     Pupils: Pupils are equal, round, and reactive to light.  Neck:     Thyroid: No thyromegaly.     Vascular: No JVD.     Trachea: No tracheal  deviation.  Cardiovascular:     Rate and Rhythm: Normal rate and regular rhythm.     Heart sounds: Normal heart sounds. No murmur heard.    No friction rub. No gallop.  Pulmonary:     Effort: Pulmonary effort is normal. No respiratory distress.     Breath sounds: Normal breath sounds. No stridor. No wheezing or rales.  Chest:     Chest wall: No tenderness.  Abdominal:     General: Bowel sounds are normal. There is no distension.     Palpations: Abdomen is soft. There is no mass.     Tenderness: There is no abdominal tenderness. There is no guarding or rebound.  Musculoskeletal:        General: Normal range of motion.     Left shoulder: Tenderness present. No bony tenderness. Normal range of motion. Normal strength.     Cervical back: Normal range of motion and neck supple.  Lymphadenopathy:     Cervical: No cervical adenopathy.  Skin:    General: Skin is warm.     Coloration: Skin is not pale.     Findings: No erythema or rash.  Neurological:     Mental Status: She is alert and oriented to person, place, and time.     Cranial Nerves: No cranial nerve deficit.     Motor: No abnormal muscle tone.     Coordination: Coordination normal.     Deep Tendon Reflexes: Reflexes are normal and symmetric.  Psychiatric:        Behavior: Behavior normal.        Thought Content: Thought content normal.        Judgment: Judgment normal.           Assessment & Plan:  Acute pain of left shoulder  Panic attacks Using sterile technique, I injected the left subacromial space with 2 cc of lidocaine, 2 cc of Marcaine, and 2 cc of 40 mg/mL Kenalog.  The patient tolerated the procedure well without complication.  I refilled her Xanax that she is using occasionally for panic attacks and I offered her my condolences regarding her dad.  If the frequency and intensity of the panic attacks increase, I would recommend starting Lexapro again which she took in the past.  We also discussed a referral to  physical therapy.  I believe that she is primarily dealing with tendinitis in the supraspinatus.  If the pain worsens I would recommend an MRI to rule out a partial tear

## 2022-01-05 ENCOUNTER — Encounter: Payer: Self-pay | Admitting: Gastroenterology

## 2022-01-05 ENCOUNTER — Ambulatory Visit: Payer: BC Managed Care – PPO | Admitting: Gastroenterology

## 2022-01-05 VITALS — BP 120/80 | HR 89 | Ht 67.0 in | Wt 179.0 lb

## 2022-01-05 DIAGNOSIS — K921 Melena: Secondary | ICD-10-CM | POA: Diagnosis not present

## 2022-01-05 MED ORDER — NA SULFATE-K SULFATE-MG SULF 17.5-3.13-1.6 GM/177ML PO SOLN: 1.0000 | Freq: Once | ORAL | 0 refills | Status: AC

## 2022-01-05 NOTE — Progress Notes (Unsigned)
HPI :   Hemorrhoid symptoms since   Bleeding, bright red blood more frequent now.  Couple times per week Sometimes light, sometimes heavy. Blood in stool, in water and on toilet paper.  Linzess helps, doesn't take it every day, usually 2-3 days/week. Having a BM every day. Straining/hard stools sometimes, especially when no Linzess Banded once in April  No diarrhea. Abdominal pain, sometimes after meals. Crampy pain. Nausea, been present a long time.  Sometimes has itching from the hemorrhoids, improved with baths No hygiene.  Dr. Marcello Moores banded, helped  Past Medical History:  Diagnosis Date   Anxiety    Phreesia 02/21/2020   Depression    no meds   Heart murmur    as child, no problems      Past Surgical History:  Procedure Laterality Date   KNEE SURGERY     right knee surgery at age 85 yrs old   SVD  2010   x 1   WISDOM TOOTH EXTRACTION     Family History  Problem Relation Age of Onset   COPD Mother    Diabetes Mother    Vision loss Mother    Diabetes Father    Hypertension Father    Breast cancer Maternal Aunt    Diabetes Maternal Grandmother    Heart disease Maternal Grandmother    Hypertension Maternal Grandmother    Hypertension Maternal Grandfather    Arthritis Paternal Grandmother    Cancer Paternal Grandmother    Stroke Paternal Grandmother    Arthritis Paternal Grandfather    Social History   Tobacco Use   Smoking status: Former    Packs/day: 0.25    Years: 3.00    Total pack years: 0.75    Types: Cigarettes    Quit date: 03/09/2011    Years since quitting: 10.8   Smokeless tobacco: Never  Substance Use Topics   Alcohol use: Yes    Alcohol/week: 0.0 standard drinks of alcohol    Comment: Socially   Drug use: No   Current Outpatient Medications  Medication Sig Dispense Refill   ALPRAZolam (XANAX) 0.5 MG tablet Take 1 tablet (0.5 mg total) by mouth 3 (three) times daily as needed. 30 tablet 3   fluticasone (FLONASE) 50 MCG/ACT  nasal spray Place 2 sprays into both nostrils daily. (Patient taking differently: Place 2 sprays into both nostrils as needed.) 16 g 6   levonorgestrel (MIRENA) 20 MCG/24HR IUD 1 each by Intrauterine route continuous.      linaclotide (LINZESS) 145 MCG CAPS capsule TAKE 1 CAPSULE BY MOUTH EVERY DAY BEFORE BREAKFAST 90 capsule 1   No current facility-administered medications for this visit.   No Known Allergies   Review of Systems: All systems reviewed and negative except where noted in HPI.    No results found.  Physical Exam: BP 120/80   Pulse 89   Ht '5\' 7"'$  (1.702 m)   Wt 179 lb (81.2 kg)   BMI 28.04 kg/m  Constitutional: Pleasant,well-developed, ***female in no acute distress. HEENT: Normocephalic and atraumatic. Conjunctivae are normal. No scleral icterus. Neck supple.  Cardiovascular: Normal rate, regular rhythm.  Pulmonary/chest: Effort normal and breath sounds normal. No wheezing, rales or rhonchi. Abdominal: Soft, nondistended, nontender. Bowel sounds active throughout. There are no masses palpable. No hepatomegaly. Extremities: no edema Lymphadenopathy: No cervical adenopathy noted. Neurological: Alert and oriented to person place and time. Skin: Skin is warm and dry. No rashes noted. Psychiatric: Normal mood and affect. Behavior is normal.  CBC  Component Value Date/Time   WBC 9.8 04/30/2021 1511   RBC 4.34 04/30/2021 1511   HGB 13.3 04/30/2021 1511   HCT 39.9 04/30/2021 1511   PLT 219 04/30/2021 1511   MCV 91.9 04/30/2021 1511   MCH 30.6 04/30/2021 1511   MCHC 33.3 04/30/2021 1511   RDW 13.0 04/30/2021 1511   LYMPHSABS 2,832 04/30/2021 1511   MONOABS 520 08/22/2015 0909   EOSABS 69 04/30/2021 1511   BASOSABS 39 04/30/2021 1511    CMP     Component Value Date/Time   NA 140 04/30/2021 1511   K 3.9 04/30/2021 1511   CL 103 04/30/2021 1511   CO2 28 04/30/2021 1511   GLUCOSE 86 04/30/2021 1511   BUN 12 04/30/2021 1511   CREATININE 0.71 04/30/2021  1511   CALCIUM 9.3 04/30/2021 1511   PROT 7.1 04/30/2021 1511   ALBUMIN 4.4 08/22/2015 0909   AST 14 04/30/2021 1511   ALT 19 04/30/2021 1511   ALKPHOS 60 08/22/2015 0909   BILITOT 0.3 04/30/2021 1511   GFRNONAA 97 05/22/2020 1442   GFRAA 112 05/22/2020 1442     ASSESSMENT AND PLAN:  Susy Frizzle, MD

## 2022-01-05 NOTE — Patient Instructions (Signed)
_______________________________________________________  If you are age 33 or older, your body mass index should be between 23-30. Your Body mass index is 28.04 kg/m. If this is out of the aforementioned range listed, please consider follow up with your Primary Care Provider.  If you are age 70 or younger, your body mass index should be between 19-25. Your Body mass index is 28.04 kg/m. If this is out of the aformentioned range listed, please consider follow up with your Primary Care Provider.   You have been scheduled for a colonoscopy. Please follow written instructions given to you at your visit today.  Please pick up your prep supplies at the pharmacy within the next 1-3 days. If you use inhalers (even only as needed), please bring them with you on the day of your procedure.   The [facility] providers would like to encourage you to use [facility] to communicate with providers for non-urgent requests or questions.  Due to long hold times on the telephone, sending your provider a message by [facility] may be a faster and more efficient way to get a response.  Please allow 48 business hours for a response.  Please remember that this is for non-urgent requests.   It was a pleasure to see you today!  Thank you for trusting me with your gastrointestinal care!    Scott E.Candis Schatz, MD

## 2022-01-27 ENCOUNTER — Encounter: Payer: Self-pay | Admitting: Gastroenterology

## 2022-02-03 ENCOUNTER — Ambulatory Visit (AMBULATORY_SURGERY_CENTER): Payer: BC Managed Care – PPO | Admitting: Gastroenterology

## 2022-02-03 ENCOUNTER — Encounter: Payer: Self-pay | Admitting: Gastroenterology

## 2022-02-03 ENCOUNTER — Encounter: Payer: Self-pay | Admitting: Family Medicine

## 2022-02-03 VITALS — BP 97/73 | HR 99 | Temp 98.0°F | Resp 13 | Ht 67.0 in | Wt 179.0 lb

## 2022-02-03 DIAGNOSIS — K640 First degree hemorrhoids: Secondary | ICD-10-CM

## 2022-02-03 DIAGNOSIS — K644 Residual hemorrhoidal skin tags: Secondary | ICD-10-CM

## 2022-02-03 DIAGNOSIS — K921 Melena: Secondary | ICD-10-CM

## 2022-02-03 DIAGNOSIS — K5730 Diverticulosis of large intestine without perforation or abscess without bleeding: Secondary | ICD-10-CM | POA: Diagnosis not present

## 2022-02-03 MED ORDER — SODIUM CHLORIDE 0.9 % IV SOLN: 500.0000 mL | Freq: Once | INTRAVENOUS | Status: DC

## 2022-02-03 NOTE — Progress Notes (Signed)
Pt's states no medical or surgical changes since previsit or office visit. VS assessed by D.T 

## 2022-02-03 NOTE — Progress Notes (Signed)
History and Physical Interval Note:  02/03/2022 11:32 AM  Charlene Scott  has presented today for endoscopic procedure(s), with the diagnosis of  Encounter Diagnosis  Name Primary?   Hematochezia Yes  .  The various methods of evaluation and treatment have been discussed with the patient and/or family. After consideration of risks, benefits and other options for treatment, the patient has consented to  the endoscopic procedure(s).   The patient's history has been reviewed, patient examined, no change in status, stable for endoscopic procedure(s).  I have reviewed the patient's chart and labs.  Questions were answered to the patient's satisfaction.     Daisy Lites E. Candis Schatz, MD [facility]

## 2022-02-03 NOTE — Op Note (Signed)
[facility] Patient Name: Charlene Scott Procedure Date: 02/03/2022 11:34 AM MRN: [id number] Endoscopist: Nicki Reaper E. Candis Schatz , MD, [id number] Age: 31 Referring MD:  Date of Birth: [birth year] Gender: Female Account #: [id number] Procedure:                Colonoscopy Indications:              Hematochezia Medicines:                Monitored Anesthesia Care Procedure:                Pre-Anesthesia Assessment:                           - Prior to the procedure, a History and Physical                            was performed, and patient medications and                            allergies were reviewed. The patient's tolerance of                            previous anesthesia was also reviewed. The risks                            and benefits of the procedure and the sedation                            options and risks were discussed with the patient.                            All questions were answered, and informed consent                            was obtained. Prior Anticoagulants: The patient has                            taken no anticoagulant or antiplatelet agents. ASA                            Grade Assessment: II - A patient with mild systemic                            disease. After reviewing the risks and benefits,                            the patient was deemed in satisfactory condition to                            undergo the procedure.                           After obtaining informed consent, the colonoscope  was passed under direct vision. Throughout the                            procedure, the patient's blood pressure, pulse, and                            oxygen saturations were monitored continuously. The                            Colonoscope was introduced through the anus and                            advanced to the the terminal ileum, with                            identification of the appendiceal orifice and IC                             valve. The colonoscopy was performed without                            difficulty. The patient tolerated the procedure                            well. The quality of the bowel preparation was                            excellent. The terminal ileum, ileocecal valve,                            appendiceal orifice, and rectum were photographed.                            The bowel preparation used was SUPREP via split                            dose instruction. Scope In: 11:41:14 AM Scope Out: 11:52:48 AM Scope Withdrawal Time: 0 hours 6 minutes 39 seconds  Total Procedure Duration: 0 hours 11 minutes 34 seconds  Findings:                 Skin tags were found on perianal exam.                           The perianal and digital rectal examinations were                            normal. Pertinent negatives include normal                            sphincter tone.                           A few small-mouthed diverticula were found in the  sigmoid colon. There was no evidence of                            diverticular bleeding.                           The exam was otherwise normal throughout the                            examined colon.                           The terminal ileum appeared normal.                           Non-bleeding internal hemorrhoids were found during                            retroflexion. The hemorrhoids were Grade I                            (internal hemorrhoids that do not prolapse).                           No additional abnormalities were found on                            retroflexion. Complications:            No immediate complications. Estimated Blood Loss:     Estimated blood loss: none. Impression:               - Perianal skin tags found on perianal exam.                           - Mild diverticulosis in the sigmoid colon. There                            was no evidence of diverticular  bleeding.                           - The examined portion of the ileum was normal.                           - Non-bleeding internal hemorrhoids. This is the                            source of the patient's hematochezia                           - No specimens collected. Recommendation:           - Patient has a contact number available for                            emergencies. The signs and symptoms of potential  delayed complications were discussed with the                            patient. Return to normal activities tomorrow.                            Written discharge instructions were provided to the                            patient.                           - Resume previous diet.                           - Continue present medications.                           - Repeat colonoscopy at age 22 for screening                            purposes.                           - Follow up with Dr. Marcello Moores for further treatment                            of hemorrhoids, if desired [name] E. Candis Schatz, MD 02/03/2022 11:57:38 AM This report has been signed electronically.

## 2022-02-03 NOTE — Progress Notes (Signed)
Sedate, gd SR, tolerated procedure well, VSS, report to RN 

## 2022-02-03 NOTE — Patient Instructions (Signed)
Patient has a contact number available for emergencies. The signs and symptoms of potential delayed complications were discussed with the patient. Return to normal activities tomorrow. Written discharge instructions were provided to the patient. - Resume previous diet. - Continue present medications. - Repeat colonoscopy at age 31 for screening purposes. - Follow up with Dr. Marcello Moores for further treatment of hemorrhoids, if desired -Handout on diverticulosis provided   YOU HAD AN ENDOSCOPIC PROCEDURE TODAY AT [facility]:   Refer to the procedure report that was given to you for any specific questions about what was found during the examination.  If the procedure report does not answer your questions, please call your gastroenterologist to clarify.  If you requested that your care partner not be given the details of your procedure findings, then the procedure report has been included in a sealed envelope for you to review at your convenience later.  YOU SHOULD EXPECT: Some feelings of bloating in the abdomen. Passage of more gas than usual.  Walking can help get rid of the air that was put into your GI tract during the procedure and reduce the bloating. If you had a lower endoscopy (such as a colonoscopy or flexible sigmoidoscopy) you may notice spotting of blood in your stool or on the toilet paper. If you underwent a bowel prep for your procedure, you may not have a normal bowel movement for a few days.  Please Note:  You might notice some irritation and congestion in your nose or some drainage.  This is from the oxygen used during your procedure.  There is no need for concern and it should clear up in a day or so.  SYMPTOMS TO REPORT IMMEDIATELY:  Following lower endoscopy (colonoscopy or flexible sigmoidoscopy):  Excessive amounts of blood in the stool  Significant tenderness or worsening of abdominal pains  Swelling of the abdomen that is new, acute  Fever of 100F or  higher   For urgent or emergent issues, a gastroenterologist can be reached at any hour by calling [phone]. Do not use MyChart messaging for urgent concerns.    DIET:  We do recommend a small meal at first, but then you may proceed to your regular diet.  Drink plenty of fluids but you should avoid alcoholic beverages for 24 hours.  ACTIVITY:  You should plan to take it easy for the rest of today and you should NOT DRIVE or use heavy machinery until tomorrow (because of the sedation medicines used during the test).    FOLLOW UP: Our staff will call the number listed on your records the next business day following your procedure.  We will call around 7:15- 8:00 am to check on you and address any questions or concerns that you may have regarding the information given to you following your procedure. If we do not reach you, we will leave a message.     If any biopsies were taken you will be contacted by phone or by letter within the next 1-3 weeks.  Please call us at [phone] if you have not heard about the biopsies in 3 weeks.    SIGNATURES/CONFIDENTIALITY: You and/or your care partner have signed paperwork which will be entered into your electronic medical record.  These signatures attest to the fact that that the information above on your After Visit Summary has been reviewed and is understood.  Full responsibility of the confidentiality of this discharge information lies with you and/or your care-partner.

## 2022-02-08 ENCOUNTER — Telehealth: Payer: Self-pay

## 2022-02-08 NOTE — Telephone Encounter (Signed)
  Follow up Call-     02/03/2022   10:17 AM  Call back number  Post procedure Call Back phone  # [phone]  Permission to leave phone message Yes     Patient questions:  Do you have a fever, pain , or abdominal swelling? No. Pain Score  0 *  Have you tolerated food without any problems? Yes.    Have you been able to return to your normal activities? Yes.    Do you have any questions about your discharge instructions: Diet   No. Medications  No. Follow up visit  No.  Do you have questions or concerns about your Care? No.  Actions: * If pain score is 4 or above: No action needed, pain <4.

## 2022-02-10 ENCOUNTER — Other Ambulatory Visit: Payer: Self-pay | Admitting: Family Medicine

## 2022-02-10 MED ORDER — FLUOXETINE HCL 20 MG PO CAPS: 20.0000 mg | ORAL_CAPSULE | Freq: Every day | ORAL | 3 refills | Status: DC

## 2022-02-18 ENCOUNTER — Encounter: Payer: Self-pay | Admitting: Family Medicine

## 2022-02-18 ENCOUNTER — Ambulatory Visit: Payer: BC Managed Care – PPO | Admitting: Family Medicine

## 2022-02-18 VITALS — BP 126/72 | HR 135 | Ht 67.0 in | Wt 169.4 lb

## 2022-02-18 DIAGNOSIS — F410 Panic disorder [episodic paroxysmal anxiety] without agoraphobia: Secondary | ICD-10-CM | POA: Diagnosis not present

## 2022-02-18 MED ORDER — PROMETHAZINE HCL 25 MG PO TABS: 25.0000 mg | ORAL_TABLET | Freq: Three times a day (TID) | ORAL | 0 refills | Status: DC | PRN

## 2022-02-18 MED ORDER — CLONAZEPAM 0.5 MG PO TABS: 0.2500 mg | ORAL_TABLET | Freq: Three times a day (TID) | ORAL | 0 refills | Status: DC | PRN

## 2022-02-18 NOTE — Progress Notes (Signed)
Subjective:    Patient ID: Charlene Scott, female    DOB: Apr 03, 1990, 31 y.o.   MRN: [id number]  On 12/31/21, the patient informed me that her father recently died suddenly from a heart attack.  Since that time she had 2 or 3 panic attacks per week.  She had to use an old prescription of Xanax which did help when she took it.  This been going on for the last 3 weeks.  I gave her xanax to use as needed.  Since I last saw the patient, her panic attacks have worsened dramatically.  They are occurring on a daily basis.  She states that she will suddenly feel tingling all over her body.  She describes it as a warm sensation that she can feel to see him from her head to her toes.  She will feel tightness develop in her chest and her heart will feel like it is racing.  They are occurring at work.  Yesterday she had to leave work because she felt overwhelmed with anxiety.  She started Prozac 1 week ago to try to prevent the anxiety.  Since starting the Prozac, she developed nausea.  She denies suicidality and she has stayed on the last 2 days due to the anxiety.  She denies symptoms or racing thoughts but she admits that she is not sleeping well.  She states that his send she wakes up she feels an overwhelming sense of fear.  She is here today with her mother. Past Medical History:  Diagnosis Date   Anxiety    [employer] 02/21/2020   Depression    no meds   Heart murmur    as child, no problems     Past Surgical History:  Procedure Laterality Date   KNEE SURGERY     right knee surgery at age 66 yrs old   SVD  2010   x 1   WISDOM TOOTH EXTRACTION     Current Outpatient Medications on File Prior to Visit  Medication Sig Dispense Refill   FLUoxetine (PROZAC) 20 MG capsule Take 1 capsule (20 mg total) by mouth daily. 90 capsule 3   ALPRAZolam (XANAX) 0.5 MG tablet Take 1 tablet (0.5 mg total) by mouth 3 (three) times daily as needed. 30 tablet 3   fluticasone (FLONASE) 50 MCG/ACT nasal spray Place 2  sprays into both nostrils daily. (Patient not taking: Reported on 02/03/2022) 16 g 6   levonorgestrel (MIRENA) 20 MCG/24HR IUD 1 each by Intrauterine route continuous.      linaclotide (LINZESS) 145 MCG CAPS capsule TAKE 1 CAPSULE BY MOUTH EVERY DAY BEFORE BREAKFAST 90 capsule 1   No current facility-administered medications on file prior to visit.   No Known Allergies Social History   Socioeconomic History   Marital status: Single    Spouse name: Not on file   Number of children: Not on file   Years of education: Not on file   Highest education level: Not on file  Occupational History   Not on file  Tobacco Use   Smoking status: Former    Packs/day: 0.25    Years: 3.00    Total pack years: 0.75    Types: Cigarettes    Quit date: 03/09/2011    Years since quitting: 10.9   Smokeless tobacco: Never  Substance and Sexual Activity   Alcohol use: Yes    Alcohol/week: 0.0 standard drinks of alcohol    Comment: Socially   Drug use: No   Sexual  activity: Yes    Partners: Male    Birth control/protection: I.U.D.  Other Topics Concern   Not on file  Social History Narrative   Not on file   Social Determinants of Health   Financial Resource Strain: Not on file  Food Insecurity: Not on file  Transportation Needs: Not on file  Physical Activity: Not on file  Stress: Not on file  Social Connections: Not on file  Intimate Partner Violence: Not on file   Family History  Problem Relation Age of Onset   COPD Mother    Diabetes Mother    Vision loss Mother    Diabetes Father    Hypertension Father    Breast cancer Maternal Aunt    Diabetes Maternal Grandmother    Heart disease Maternal Grandmother    Hypertension Maternal Grandmother    Hypertension Maternal Grandfather    Arthritis Paternal Grandmother    Cancer Paternal Grandmother    Stroke Paternal Grandmother    Arthritis Paternal Grandfather    Colon polyps Neg Hx    Esophageal cancer Neg Hx    Stomach cancer Neg  Hx    Rectal cancer Neg Hx       Review of Systems  All other systems reviewed and are negative.      Objective:   Physical Exam Vitals reviewed.  Constitutional:      General: She is not in acute distress.    Appearance: She is well-developed. She is not diaphoretic.  Neck:     Thyroid: No thyromegaly.     Vascular: No JVD.     Trachea: No tracheal deviation.  Cardiovascular:     Rate and Rhythm: Normal rate and regular rhythm.     Heart sounds: Normal heart sounds. No murmur heard.    No friction rub. No gallop.  Pulmonary:     Effort: Pulmonary effort is normal. No respiratory distress.     Breath sounds: Normal breath sounds. No stridor. No wheezing or rales.  Chest:     Chest wall: No tenderness.  Skin:    Findings: No rash.  Neurological:     Mental Status: She is alert.     Motor: No abnormal muscle tone.     Deep Tendon Reflexes: Reflexes are normal and symmetric.  Psychiatric:        Attention and Perception: Attention normal.        Mood and Affect: Mood is anxious.        Speech: Speech normal.        Behavior: Behavior normal.        Thought Content: Thought content normal.        Judgment: Judgment normal.           Assessment & Plan:   Panic disorder Patient is having uncontrolled panic attacks.  I reassured the patient that this will get better.  I want her to continue the Prozac at the present time.  We will try Klonopin 0.5 mg 2-3 times a day scheduled regain some control over the anxiety.  We will then wean away from the Klonopin as the Prozac takes effect.  Discontinue Xanax.  Reassess in 1 month or sooner if worsening

## 2022-03-17 ENCOUNTER — Ambulatory Visit
Admission: RE | Admit: 2022-03-17 | Discharge: 2022-03-17 | Disposition: A | Discharge status: Home / Self Care | Payer: BC Managed Care – PPO | Source: Ambulatory Visit | Attending: Obstetrics and Gynecology | Admitting: Obstetrics and Gynecology

## 2022-03-17 DIAGNOSIS — N6320 Unspecified lump in the left breast, unspecified quadrant: Secondary | ICD-10-CM

## 2022-03-19 ENCOUNTER — Other Ambulatory Visit: Payer: Self-pay | Admitting: Family Medicine

## 2022-03-19 MED ORDER — OSELTAMIVIR PHOSPHATE 75 MG PO CAPS: 75.0000 mg | ORAL_CAPSULE | Freq: Every day | ORAL | 0 refills | Status: DC

## 2022-03-21 ENCOUNTER — Encounter: Payer: Self-pay | Admitting: Family Medicine

## 2022-03-23 ENCOUNTER — Ambulatory Visit (INDEPENDENT_AMBULATORY_CARE_PROVIDER_SITE_OTHER): Payer: BC Managed Care – PPO | Admitting: Family Medicine

## 2022-03-23 ENCOUNTER — Encounter: Payer: Self-pay | Admitting: Family Medicine

## 2022-03-23 VITALS — BP 120/80 | HR 98 | Ht 66.0 in | Wt 166.0 lb

## 2022-03-23 DIAGNOSIS — R1013 Epigastric pain: Secondary | ICD-10-CM | POA: Diagnosis not present

## 2022-03-23 MED ORDER — PANTOPRAZOLE SODIUM 40 MG PO TBEC: 40.0000 mg | DELAYED_RELEASE_TABLET | Freq: Two times a day (BID) | ORAL | 3 refills | Status: DC

## 2022-03-23 MED ORDER — SUCRALFATE 1 G PO TABS: 1.0000 g | ORAL_TABLET | Freq: Three times a day (TID) | ORAL | 0 refills | Status: DC

## 2022-03-23 NOTE — Progress Notes (Signed)
Subjective:    Patient ID: Charlene Scott, female    DOB: 27-May-1990, 32 y.o.   MRN: [id number]  Patient reports severe pain in her epigastric area.  She describes the pain as a burning sensation like a fire.  There is no visible rash in the area where she is hurting.  She states that she has been having more reflux.  It does seem that as her pain.  She is nontender occasional right upper quadrant.  She is not jaundiced.  She does not drink alcohol.  There is no tenderness to palpation over the pancreas.  She denies any melena or hematochezia or constipation or diarrhea.  She denies any fevers or chills. Past Medical History:  Diagnosis Date   Anxiety    [employer]reesia 02/21/2020   Depression    no meds   Heart murmur    as child, no problems     Past Surgical History:  Procedure Laterality Date   KNEE SURGERY     right knee surgery at age 42 yrs old   SVD  2010   x 1   WISDOM TOOTH EXTRACTION     Current Outpatient Medications on File Prior to Visit  Medication Sig Dispense Refill   clonazePAM (KLONOPIN) 0.5 MG tablet Take 0.5 tablets (0.25 mg total) by mouth 3 (three) times daily as needed for anxiety. 90 tablet 0   FLUoxetine (PROZAC) 20 MG capsule Take 1 capsule (20 mg total) by mouth daily. 90 capsule 3   levonorgestrel (MIRENA) 20 MCG/24HR IUD 1 each by Intrauterine route continuous.      linaclotide (LINZESS) 145 MCG CAPS capsule TAKE 1 CAPSULE BY MOUTH EVERY DAY BEFORE BREAKFAST 90 capsule 1   oseltamivir (TAMIFLU) 75 MG capsule Take 1 capsule (75 mg total) by mouth daily. Dx: Z20.828- exposure to influenza 7 capsule 0   promethazine (PHENERGAN) 25 MG tablet Take 1 tablet (25 mg total) by mouth every 8 (eight) hours as needed for nausea or vomiting. 20 tablet 0   No current facility-administered medications on file prior to visit.   No Known Allergies Social History   Socioeconomic History   Marital status: Single    Spouse name: Not on file   Number of children: Not on file    Years of education: Not on file   Highest education level: Not on file  Occupational History   Not on file  Tobacco Use   Smoking status: Former    Packs/day: 0.25    Years: 3.00    Total pack years: 0.75    Types: Cigarettes    Quit date: 03/09/2011    Years since quitting: 11.0   Smokeless tobacco: Never  Substance and Sexual Activity   Alcohol use: Yes    Alcohol/week: 0.0 standard drinks of alcohol    Comment: Socially   Drug use: No   Sexual activity: Yes    Partners: Male    Birth control/protection: I.U.D.  Other Topics Concern   Not on file  Social History Narrative   Not on file   Social Determinants of Health   Financial Resource Strain: Not on file  Food Insecurity: Not on file  Transportation Needs: Not on file  Physical Activity: Not on file  Stress: Not on file  Social Connections: Not on file  Intimate Partner Violence: Not on file   Family History  Problem Relation Age of Onset   COPD Mother    Diabetes Mother    Vision loss Mother  Diabetes Father    Hypertension Father    Breast cancer Maternal Aunt    Diabetes Maternal Grandmother    Heart disease Maternal Grandmother    Hypertension Maternal Grandmother    Hypertension Maternal Grandfather    Arthritis Paternal Grandmother    Cancer Paternal Grandmother    Stroke Paternal Grandmother    Arthritis Paternal Grandfather    Colon polyps Neg Hx    Esophageal cancer Neg Hx    Stomach cancer Neg Hx    Rectal cancer Neg Hx       Review of Systems  All other systems reviewed and are negative.      Objective:   Physical Exam Vitals reviewed.  Constitutional:      General: She is not in acute distress.    Appearance: She is well-developed. She is not diaphoretic.  Neck:     Thyroid: No thyromegaly.     Vascular: No JVD.     Trachea: No tracheal deviation.  Cardiovascular:     Rate and Rhythm: Normal rate and regular rhythm.     Heart sounds: Normal heart sounds. No murmur  heard.    No friction rub. No gallop.  Pulmonary:     Effort: Pulmonary effort is normal. No respiratory distress.     Breath sounds: Normal breath sounds. No stridor. No wheezing or rales.  Chest:     Chest wall: No tenderness.  Abdominal:     General: Abdomen is flat. Bowel sounds are normal. There is no distension. There are no signs of injury.     Palpations: Abdomen is soft. There is no hepatomegaly or mass.     Tenderness: There is no abdominal tenderness.    Skin:    Findings: No rash.  Neurological:     Mental Status: She is alert.     Motor: No abnormal muscle tone.     Deep Tendon Reflexes: Reflexes are normal and symmetric.  Psychiatric:        Attention and Perception: Attention normal.        Mood and Affect: Mood is anxious.        Speech: Speech normal.        Behavior: Behavior normal.        Thought Content: Thought content normal.        Judgment: Judgment normal.           Assessment & Plan:   Epigastric pain - Plan: pantoprazole (PROTONIX) 40 MG tablet, sucralfate (CARAFATE) 1 g tablet, CBC with Differential/Platelet, COMPLETE METABOLIC PANEL WITH GFR, Lipase, hCG, quantitative, pregnancy I suspect gastritis versus possibly an ulcer given the severity of pain.  Is building for over a week.  Begin Protonix 40 mg twice daily and sucralfate 1 g p.o. 3 times daily.  Avoid any NSAIDs.  Check CBC CMP and lipase as well as pregnancy test however she has an IUD.  Do not suspect gallstones based on her abdominal exam.  Consider EGD and evaluation for H. pylori if persistent

## 2022-03-24 ENCOUNTER — Encounter: Payer: Self-pay | Admitting: Family Medicine

## 2022-03-24 LAB — COMPLETE METABOLIC PANEL WITH GFR
AG Ratio: 1.9 (calc) (ref 1.0–2.5)
ALT: 14 U/L (ref 6–29)
AST: 14 U/L (ref 10–30)
Albumin: 5 g/dL (ref 3.6–5.1)
Alkaline phosphatase (APISO): 57 U/L (ref 31–125)
BUN: 10 mg/dL (ref 7–25)
CO2: 27 mmol/L (ref 20–32)
Calcium: 10.1 mg/dL (ref 8.6–10.2)
Chloride: 102 mmol/L (ref 98–110)
Creat: 0.8 mg/dL (ref 0.50–0.97)
Globulin: 2.6 g/dL (calc) (ref 1.9–3.7)
Glucose, Bld: 108 mg/dL — ABNORMAL HIGH (ref 65–99)
Potassium: 3.9 mmol/L (ref 3.5–5.3)
Sodium: 139 mmol/L (ref 135–146)
Total Bilirubin: 0.4 mg/dL (ref 0.2–1.2)
Total Protein: 7.6 g/dL (ref 6.1–8.1)
eGFR: 100 mL/min/{1.73_m2} (ref >=60)

## 2022-03-24 LAB — CBC WITH DIFFERENTIAL/PLATELET
Absolute Monocytes: 729 cells/uL (ref 200–950)
Basophils Absolute: 36 cells/uL (ref 0–200)
Basophils Relative: 0.4 %
Eosinophils Absolute: 9 cells/uL — ABNORMAL LOW (ref 15–500)
Eosinophils Relative: 0.1 %
HCT: 41.7 % (ref 35.0–45.0)
Hemoglobin: 14.5 g/dL (ref 11.7–15.5)
Lymphs Abs: 1809 cells/uL (ref 850–3900)
MCH: 31.5 pg (ref 27.0–33.0)
MCHC: 34.8 g/dL (ref 32.0–36.0)
MCV: 90.7 fL (ref 80.0–100.0)
MPV: 10.8 fL (ref 7.5–12.5)
Monocytes Relative: 8.1 %
Neutro Abs: 6417 cells/uL (ref 1500–7800)
Neutrophils Relative %: 71.3 %
Platelets: 236 10*3/uL (ref 140–400)
RBC: 4.6 10*6/uL (ref 3.80–5.10)
RDW: 12.9 % (ref 11.0–15.0)
Total Lymphocyte: 20.1 %
WBC: 9 10*3/uL (ref 3.8–10.8)

## 2022-03-24 LAB — HCG, QUANTITATIVE, PREGNANCY: HCG, Total, QN: <5 m[IU]/mL

## 2022-03-24 LAB — LIPASE: Lipase: 12 U/L (ref 7–60)

## 2022-03-29 ENCOUNTER — Ambulatory Visit: Payer: BC Managed Care – PPO | Admitting: Family Medicine

## 2022-05-03 ENCOUNTER — Other Ambulatory Visit: Payer: Self-pay | Admitting: Physician Assistant

## 2022-05-03 ENCOUNTER — Encounter: Payer: Self-pay | Admitting: Family Medicine

## 2022-05-03 DIAGNOSIS — R1011 Right upper quadrant pain: Secondary | ICD-10-CM

## 2022-05-07 ENCOUNTER — Ambulatory Visit (INDEPENDENT_AMBULATORY_CARE_PROVIDER_SITE_OTHER): Payer: BC Managed Care – PPO | Admitting: Family Medicine

## 2022-05-07 ENCOUNTER — Encounter: Payer: Self-pay | Admitting: Family Medicine

## 2022-05-07 VITALS — BP 112/62 | HR 92 | Temp 98.9°F | Ht 66.0 in | Wt 163.0 lb

## 2022-05-07 DIAGNOSIS — Z0000 Encounter for general adult medical examination without abnormal findings: Secondary | ICD-10-CM

## 2022-05-07 NOTE — Progress Notes (Signed)
Subjective:    Patient ID: Charlene Scott, female    DOB: [birth year], 32 y.o.   MRN: [id number]   Patient is a very sweet 32 year old Caucasian female here today for complete physical exam.  She currently sees her gynecologist for her Pap smears.  She has no family history of premature breast cancer so she is not due for mammogram simply for screening purposes.  She had a mammogram in 2022 has pain.  She has been getting follow-up ultrasounds due to a benign breast mass that was seen.  They contact her regarding this.  She is not yet due for colonoscopy.  On a positive note, her anxiety and depression have improved.  She has been off her medications since December and is doing well.  She still has intermittent days of pain.  States that anxiety essentially all be continued there with epigastric discomfort.  Recently she was at an urgent care where they diagnosed her with H. pylori.  She has been taking triple therapy now.  She will be off of triple therapy in March.  She also has been scheduled for right upper quadrant ultrasound to evaluate for gallstones.  She wants to get the ultrasound after she completes the triple therapy. Past Medical History:  Diagnosis Date   Anxiety    Phreesia 02/21/2020   Depression    no meds   Heart murmur    as child, no problems     Past Surgical History:  Procedure Laterality Date   KNEE SURGERY     right knee surgery at age 32 yrs old yrs old   SVD  2010   x 1   WISDOM TOOTH EXTRACTION     Current Outpatient Medications on File Prior to Visit  Medication Sig Dispense Refill   amoxicillin (AMOXIL) 500 MG capsule Take 500 mg by mouth 3 (three) times daily.     clarithromycin (BIAXIN XL) 500 MG 24 hr tablet Take 1,000 mg by mouth daily.     clonazePAM (KLONOPIN) 0.5 MG tablet Take 0.5 tablets (0.25 mg total) by mouth 3 (three) times daily as needed for anxiety. 90 tablet 0   FLUoxetine (PROZAC) 20 MG capsule Take 1 capsule (20 mg total) by mouth daily. 90 capsule  3   levonorgestrel (MIRENA) 20 MCG/24HR IUD 1 each by Intrauterine route continuous.      linaclotide (LINZESS) 145 MCG CAPS capsule TAKE 1 CAPSULE BY MOUTH EVERY DAY BEFORE BREAKFAST 90 capsule 1   metroNIDAZOLE (FLAGYL) 500 MG tablet Take 500 mg by mouth 3 (three) times daily.     oseltamivir (TAMIFLU) 75 MG capsule Take 1 capsule (75 mg total) by mouth daily. Dx: Z20.828- exposure to influenza 7 capsule 0   pantoprazole (PROTONIX) 40 MG tablet Take 1 tablet (40 mg total) by mouth 2 (two) times daily. 60 tablet 3   promethazine (PHENERGAN) 25 MG tablet Take 1 tablet (25 mg total) by mouth every 8 (eight) hours as needed for nausea or vomiting. 20 tablet 0   sucralfate (CARAFATE) 1 g tablet Take 1 tablet (1 g total) by mouth 4 (four) times daily -  with meals and at bedtime. 120 tablet 0   No current facility-administered medications on file prior to visit.   No Known Allergies Social History   Socioeconomic History   Marital status: Single    Spouse name: Not on file   Number of children: Not on file   Years of education: Not on file   Highest education level: Not  on file  Occupational History   Not on file  Tobacco Use   Smoking status: Former    Packs/day: 0.25    Years: 3.00    Total pack years: 0.75    Types: Cigarettes    Quit date: 03/09/2011    Years since quitting: 11.1   Smokeless tobacco: Never  Substance and Sexual Activity   Alcohol use: Yes    Alcohol/week: 0.0 standard drinks of alcohol    Comment: Socially   Drug use: No   Sexual activity: Yes    Partners: Male    Birth control/protection: I.U.D.  Other Topics Concern   Not on file  Social History Narrative   Not on file   Social Determinants of Health   Financial Resource Strain: Not on file  Food Insecurity: Not on file  Transportation Needs: Not on file  Physical Activity: Not on file  Stress: Not on file  Social Connections: Not on file  Intimate Partner Violence: Not on file   Family History   Problem Relation Age of Onset   COPD Mother    Diabetes Mother    Vision loss Mother    Diabetes Father    Hypertension Father    Breast cancer Maternal Aunt    Diabetes Maternal Grandmother    Heart disease Maternal Grandmother    Hypertension Maternal Grandmother    Hypertension Maternal Grandfather    Arthritis Paternal Grandmother    Cancer Paternal Grandmother    Stroke Paternal Grandmother    Arthritis Paternal Grandfather    Colon polyps Neg Hx    Esophageal cancer Neg Hx    Stomach cancer Neg Hx    Rectal cancer Neg Hx       Review of Systems  Psychiatric/Behavioral:  Positive for depression.   All other systems reviewed and are negative.      Objective:   Physical Exam Vitals reviewed.  Constitutional:      General: She is not in acute distress.    Appearance: She is well-developed. She is not diaphoretic.  HENT:     Head: Normocephalic and atraumatic.      Right Ear: Tympanic membrane, ear canal and external ear normal.     Left Ear: Tympanic membrane, ear canal and external ear normal.     Nose: Nose normal.     Mouth/Throat:     Pharynx: No oropharyngeal exudate.  Eyes:     General: No scleral icterus.       Right eye: No discharge.        Left eye: No discharge.     Conjunctiva/sclera: Conjunctivae normal.     Pupils: Pupils are equal, round, and reactive to light.  Neck:     Thyroid: No thyromegaly.     Vascular: No carotid bruit or JVD.     Trachea: No tracheal deviation.  Cardiovascular:     Rate and Rhythm: Normal rate and regular rhythm.     Heart sounds: Normal heart sounds. No murmur heard.    No friction rub. No gallop.  Pulmonary:     Effort: Pulmonary effort is normal. No respiratory distress.     Breath sounds: Normal breath sounds. No stridor. No wheezing or rales.  Chest:     Chest wall: No tenderness.  Abdominal:     General: Bowel sounds are normal. There is no distension.     Palpations: Abdomen is soft. There is no mass.      Tenderness: There is no abdominal tenderness.  There is no guarding or rebound.  Musculoskeletal:        General: No tenderness. Normal range of motion.     Cervical back: Normal range of motion and neck supple. No tenderness.  Lymphadenopathy:     Cervical: No cervical adenopathy.  Skin:    General: Skin is warm.     Coloration: Skin is not jaundiced or pale.     Findings: No bruising, erythema, lesion or rash.  Neurological:     General: No focal deficit present.     Mental Status: She is alert and oriented to person, place, and time. Mental status is at baseline.     Cranial Nerves: No cranial nerve deficit.     Sensory: No sensory deficit.     Motor: No weakness or abnormal muscle tone.     Coordination: Coordination normal.     Gait: Gait normal.     Deep Tendon Reflexes: Reflexes are normal and symmetric. Reflexes normal.  Psychiatric:        Behavior: Behavior normal.        Thought Content: Thought content normal.        Judgment: Judgment normal.           Assessment & Plan:  General medical exam - Plan: COMPLETE METABOLIC PANEL WITH GFR, Lipid panel' \\I'$  am very happy that her anxiety has improved.  She is currently off benzodiazepines and antidepressants.  Her abdominal pain has improved on the proton pump inhibitor however she continues to have episodes of pain.  Hopefully the urgent care is on the right path with triple therapy.  If her pain resolves after taking triple therapy, no further workup is necessary.  If persistent, I agree that a right upper quadrant ultrasound is prudent to rule out biliary colic.  Breast cancer screening is up-to-date.  Not yet due for colon cancer screening.  Pap smear is deferred to her gynecologist.  Patient politely declines a flu shot and a COVID  shot

## 2022-05-08 LAB — LIPID PANEL
Cholesterol: 134 mg/dL (ref <200)
HDL: 51 mg/dL (ref >=50)
LDL Cholesterol (Calc): 69 mg/dL (calc)
Non-HDL Cholesterol (Calc): 83 mg/dL (calc) (ref <130)
Total CHOL/HDL Ratio: 2.6 (calc) (ref <5.0)
Triglycerides: 48 mg/dL (ref <150)

## 2022-05-08 LAB — COMPLETE METABOLIC PANEL WITH GFR
AG Ratio: 1.9 (calc) (ref 1.0–2.5)
ALT: 9 U/L (ref 6–29)
AST: 12 U/L (ref 10–30)
Albumin: 4.5 g/dL (ref 3.6–5.1)
Alkaline phosphatase (APISO): 57 U/L (ref 31–125)
BUN: 14 mg/dL (ref 7–25)
CO2: 28 mmol/L (ref 20–32)
Calcium: 9.4 mg/dL (ref 8.6–10.2)
Chloride: 104 mmol/L (ref 98–110)
Creat: 0.87 mg/dL (ref 0.50–0.97)
Globulin: 2.4 g/dL (calc) (ref 1.9–3.7)
Glucose, Bld: 97 mg/dL (ref 65–99)
Potassium: 4.2 mmol/L (ref 3.5–5.3)
Sodium: 140 mmol/L (ref 135–146)
Total Bilirubin: 0.5 mg/dL (ref 0.2–1.2)
Total Protein: 6.9 g/dL (ref 6.1–8.1)
eGFR: 90 mL/min/{1.73_m2} (ref >=60)

## 2022-05-09 ENCOUNTER — Encounter: Payer: Self-pay | Admitting: Gastroenterology

## 2022-05-09 ENCOUNTER — Encounter: Payer: Self-pay | Admitting: Family Medicine

## 2022-05-10 ENCOUNTER — Other Ambulatory Visit: Payer: Self-pay

## 2022-05-10 DIAGNOSIS — A048 Other specified bacterial intestinal infections: Secondary | ICD-10-CM

## 2022-05-28 ENCOUNTER — Other Ambulatory Visit: Payer: BC Managed Care – PPO

## 2022-06-16 ENCOUNTER — Telehealth: Payer: Self-pay | Admitting: Gastroenterology

## 2022-06-16 NOTE — Telephone Encounter (Signed)
Spoke with pt and let her know she can come for the stool test to the lab, she knows she needs to be off protonix for at least 14 days prior to submitting sample.

## 2022-06-16 NOTE — Telephone Encounter (Signed)
Patient said she has finished the antibotics for her H-pylori and wants to know if she can still come to the lab to get the test to make sure it has cleared up?

## 2022-06-18 ENCOUNTER — Other Ambulatory Visit: Payer: Self-pay | Admitting: Family Medicine

## 2022-06-18 DIAGNOSIS — R1013 Epigastric pain: Secondary | ICD-10-CM

## 2022-06-18 NOTE — Telephone Encounter (Signed)
Requested Prescriptions  Pending Prescriptions Disp Refills   pantoprazole (PROTONIX) 40 MG tablet [Pharmacy Med Name: PANTOPRAZOLE SOD DR 40 MG TAB] 180 tablet 0    Sig: TAKE 1 TABLET BY MOUTH TWICE A DAY     Gastroenterology: Proton Pump Inhibitors Failed - 06/18/2022  2:26 AM      Failed - Valid encounter within last 12 months    Recent Outpatient Visits           1 year ago General medical exam   [facility] Donita Brooks, MD   2 years ago Elevated fasting blood sugar   [facility] Pickard, Priscille Heidelberg, MD   2 years ago General medical exam   [facility] Donita Brooks, MD   2 years ago Lateral epicondylitis of right elbow   [facility] Tanya Nones, Priscille Heidelberg, MD   3 years ago Irritant dermatitis   [facility] Pickard, Priscille Heidelberg, MD

## 2022-06-21 ENCOUNTER — Other Ambulatory Visit: Payer: BC Managed Care – PPO

## 2022-07-12 ENCOUNTER — Other Ambulatory Visit: Payer: Self-pay

## 2022-07-12 DIAGNOSIS — A048 Other specified bacterial intestinal infections: Secondary | ICD-10-CM

## 2022-07-13 LAB — HELICOBACTER PYLORI  SPECIAL ANTIGEN
MICRO NUMBER:: 14886779
SPECIMEN QUALITY: ADEQUATE

## 2022-07-15 NOTE — Progress Notes (Signed)
Charlene Scott,  Good news! Your H. Pylori stool test was negative, indicating that it was successfully eradicated with the antibiotics.  No further follow up or treatment is needed.

## 2022-10-12 ENCOUNTER — Other Ambulatory Visit: Payer: Self-pay | Admitting: Family Medicine

## 2022-10-12 NOTE — Telephone Encounter (Signed)
Prescription Request  10/12/2022  LOV: 05/07/2022  What is the name of the medication or equipment?   linaclotide (LINZESS) 145 MCG CAPS capsule  **90 day script requested**  Have you contacted your pharmacy to request a refill? Yes   Which pharmacy would you like this sent to?  [id number] [place], [place] - [address] [place] [address] Phone: [phone] Fax: [phone]    Patient notified that their request is being sent to the clinical staff for review and that they should receive a response within 2 business days.   Please advise pharmacist.

## 2022-10-13 MED ORDER — LINACLOTIDE 145 MCG PO CAPS: ORAL_CAPSULE | ORAL | 1 refills | Status: DC

## 2022-10-13 NOTE — Telephone Encounter (Signed)
Last OV 03/23/22 Requested Prescriptions  Pending Prescriptions Disp Refills   linaclotide (LINZESS) 145 MCG CAPS capsule 90 capsule 1    Sig: TAKE 1 CAPSULE BY MOUTH EVERY DAY BEFORE BREAKFAST     Gastroenterology: Irritable Bowel Syndrome Failed - 10/12/2022  9:55 AM      Failed - Valid encounter within last 12 months    Recent Outpatient Visits           1 year ago General medical exam   [facility] Donita Brooks, MD   2 years ago Elevated fasting blood sugar   [facility] Pickard, Priscille Heidelberg, MD   2 years ago General medical exam   [facility] Donita Brooks, MD   3 years ago Lateral epicondylitis of right elbow   [facility] Tanya Nones, Priscille Heidelberg, MD   3 years ago Irritant dermatitis   [facility] Pickard, Priscille Heidelberg, MD

## 2022-11-04 ENCOUNTER — Telehealth: Payer: Self-pay

## 2022-11-04 DIAGNOSIS — R1013 Epigastric pain: Secondary | ICD-10-CM | POA: Insufficient documentation

## 2022-11-04 NOTE — Telephone Encounter (Signed)
PA-Linzess sent to plan

## 2022-11-05 NOTE — Telephone Encounter (Signed)
PA-Linzess        Denied on August 22 by [employer]CP[employer] 2017 Your PA request has been denied. Additional information will be provided in the denial communication. (Message [phone])

## 2022-11-24 ENCOUNTER — Other Ambulatory Visit: Payer: Self-pay | Admitting: Obstetrics and Gynecology

## 2022-11-24 DIAGNOSIS — N63 Unspecified lump in unspecified breast: Secondary | ICD-10-CM

## 2022-12-08 ENCOUNTER — Other Ambulatory Visit: Payer: Self-pay | Admitting: Obstetrics and Gynecology

## 2022-12-08 DIAGNOSIS — N63 Unspecified lump in unspecified breast: Secondary | ICD-10-CM

## 2022-12-31 ENCOUNTER — Ambulatory Visit
Admission: RE | Admit: 2022-12-31 | Discharge: 2022-12-31 | Disposition: A | Discharge status: Home / Self Care | Payer: Federal, State, Local not specified - PPO | Source: Ambulatory Visit | Attending: Obstetrics and Gynecology | Admitting: Obstetrics and Gynecology

## 2022-12-31 ENCOUNTER — Ambulatory Visit
Admission: RE | Admit: 2022-12-31 | Discharge: 2022-12-31 | Disposition: A | Discharge status: Home / Self Care | Payer: Self-pay | Source: Ambulatory Visit | Attending: Obstetrics and Gynecology | Admitting: Obstetrics and Gynecology

## 2022-12-31 DIAGNOSIS — N63 Unspecified lump in unspecified breast: Secondary | ICD-10-CM

## 2023-01-12 ENCOUNTER — Other Ambulatory Visit: Payer: Self-pay

## 2023-09-22 ENCOUNTER — Ambulatory Visit: Admitting: Family Medicine

## 2023-09-22 VITALS — BP 124/88 | HR 100 | Temp 98.5°F | Ht 66.0 in | Wt 184.0 lb

## 2023-09-22 DIAGNOSIS — Z0000 Encounter for general adult medical examination without abnormal findings: Secondary | ICD-10-CM

## 2023-09-22 MED ORDER — CLONAZEPAM 0.5 MG PO TABS: 0.2500 mg | ORAL_TABLET | Freq: Three times a day (TID) | ORAL | 0 refills | Status: AC | PRN

## 2023-09-22 MED ORDER — LINACLOTIDE 145 MCG PO CAPS: ORAL_CAPSULE | ORAL | 1 refills | Status: AC

## 2023-09-22 NOTE — Progress Notes (Signed)
 Subjective:    Patient ID: Charlene Scott, female    DOB: 1990/09/09, 33 y.o.   MRN: [id number]   Patient is a very sweet 33 year old Caucasian female here today for complete physical exam.  Last year she was dealing with a lot of abdominal pain.  However after treatment for H. pylori, her abdominal pain has subsided.  She has some intermittent constipation but otherwise she is doing quite well.  She occasionally uses Klonopin  for anxiety however she uses this sparingly.  She starting to exercise on a daily basis.  Her Pap smear is performed by her gynecologist. Past Medical History:  Diagnosis Date   Anxiety    [employer]reesia 02/21/2020   Depression    no meds   Heart murmur    as child, no problems     Past Surgical History:  Procedure Laterality Date   KNEE SURGERY     right knee surgery at age 33 yrs old   SVD  2010   x 1   WISDOM TOOTH EXTRACTION     Current Outpatient Medications on File Prior to Visit  Medication Sig Dispense Refill   levonorgestrel (MIRENA) 20 MCG/24HR IUD 1 each by Intrauterine route continuous.      No current facility-administered medications on file prior to visit.   No Known Allergies Social History   Socioeconomic History   Marital status: Single    Spouse name: Not on file   Number of children: Not on file   Years of education: Not on file   Highest education level: Some college, no degree  Occupational History   Not on file  Tobacco Use   Smoking status: Former    Current packs/day: 0.00    Average packs/day: 0.3 packs/day for 3.0 years (0.8 ttl pk-yrs)    Types: Cigarettes    Start date: 03/08/2008    Quit date: 03/09/2011    Years since quitting: 12.5   Smokeless tobacco: Never  Substance and Sexual Activity   Alcohol use: Yes    Alcohol/week: 0.0 standard drinks of alcohol    Comment: Socially   Drug use: No   Sexual activity: Yes    Partners: Male    Birth control/protection: I.U.D.  Other Topics Concern   Not on file  Social  History Narrative   Not on file   Social Drivers of Health   Financial Resource Strain: Low Risk  (09/22/2023)   Overall Financial Resource Strain (CARDIA)    Difficulty of Paying Living Expenses: Not very hard  Food Insecurity: No Food Insecurity (09/22/2023)   Hunger Vital Sign    Worried About Running Out of Food in the Last Year: Never true    Ran Out of Food in the Last Year: Never true  Transportation Needs: No Transportation Needs (09/22/2023)   PRAPARE - Administrator, Civil Service (Medical): No    Lack of Transportation (Non-Medical): No  Physical Activity: Sufficiently Active (09/22/2023)   Exercise Vital Sign    Days of Exercise per Week: 3 days    Minutes of Exercise per Session: 50 min  Stress: Stress Concern Present (09/22/2023)   [employer] of Occupational Health - Occupational Stress Questionnaire    Feeling of Stress: To some extent  Social Connections: Socially Integrated (09/22/2023)   Social Connection and Isolation Panel    Frequency of Communication with Friends and Family: More than three times a week    Frequency of Social Gatherings with Friends and Family: Twice a  week    Attends Religious Services: More than 4 times per year    Active Member of Clubs or Organizations: Yes    Attends Banker Meetings: Never    Marital Status: Married  Catering manager Violence: Not on file   Family History  Problem Relation Age of Onset   COPD Mother    Diabetes Mother    Vision loss Mother    Diabetes Father    Hypertension Father    Breast cancer Maternal Aunt    Diabetes Maternal Grandmother    Heart disease Maternal Grandmother    Hypertension Maternal Grandmother    Hypertension Maternal Grandfather    Arthritis Paternal Grandmother    Cancer Paternal Grandmother    Stroke Paternal Grandmother    Arthritis Paternal Grandfather    Colon polyps Neg Hx    Esophageal cancer Neg Hx    Stomach cancer Neg Hx    Rectal cancer Neg Hx        Review of Systems  All other systems reviewed and are negative.      Objective:   Physical Exam Vitals reviewed.  Constitutional:      General: She is not in acute distress.    Appearance: She is well-developed. She is not diaphoretic.  HENT:     Head: Normocephalic and atraumatic.     Right Ear: Tympanic membrane, ear canal and external ear normal.     Left Ear: Tympanic membrane, ear canal and external ear normal.     Nose: Nose normal.     Mouth/Throat:     Pharynx: No oropharyngeal exudate.  Eyes:     General: No scleral icterus.       Right eye: No discharge.        Left eye: No discharge.     Conjunctiva/sclera: Conjunctivae normal.     Pupils: Pupils are equal, round, and reactive to light.  Neck:     Thyroid: No thyromegaly.     Vascular: No carotid bruit or JVD.     Trachea: No tracheal deviation.  Cardiovascular:     Rate and Rhythm: Normal rate and regular rhythm.     Heart sounds: Normal heart sounds. No murmur heard.    No friction rub. No gallop.  Pulmonary:     Effort: Pulmonary effort is normal. No respiratory distress.     Breath sounds: Normal breath sounds. No stridor. No wheezing or rales.  Chest:     Chest wall: No tenderness.  Abdominal:     General: Bowel sounds are normal. There is no distension.     Palpations: Abdomen is soft. There is no mass.     Tenderness: There is no abdominal tenderness. There is no guarding or rebound.  Musculoskeletal:        General: No tenderness. Normal range of motion.     Cervical back: Normal range of motion and neck supple. No tenderness.  Lymphadenopathy:     Cervical: No cervical adenopathy.  Skin:    General: Skin is warm.     Coloration: Skin is not jaundiced or pale.     Findings: No bruising, erythema, lesion or rash.  Neurological:     General: No focal deficit present.     Mental Status: She is alert and oriented to person, place, and time. Mental status is at baseline.     Cranial  Nerves: No cranial nerve deficit.     Sensory: No sensory deficit.     Motor: No  weakness or abnormal muscle tone.     Coordination: Coordination normal.     Gait: Gait normal.     Deep Tendon Reflexes: Reflexes are normal and symmetric. Reflexes normal.  Psychiatric:        Behavior: Behavior normal.        Thought Content: Thought content normal.        Judgment: Judgment normal.           Assessment & Plan:    General medical exam - Plan: CBC with Differential/Platelet, Comprehensive metabolic panel with GFR, Lipid panel I am very happy with her blood pressure.  I will check a CBC a CMP and a lipid panel.  Defer her Pap smear to her gynecologist.  Immunizations are up-to-date.

## 2023-09-23 ENCOUNTER — Ambulatory Visit: Payer: Self-pay | Admitting: Family Medicine

## 2023-09-23 LAB — LIPID PANEL
Cholesterol: 143 mg/dL (ref <200)
HDL: 45 mg/dL — ABNORMAL LOW (ref >=50)
LDL Cholesterol (Calc): 84 mg/dL
Non-HDL Cholesterol (Calc): 98 mg/dL (ref <130)
Total CHOL/HDL Ratio: 3.2 (calc) (ref <5.0)
Triglycerides: 49 mg/dL (ref <150)

## 2023-09-23 LAB — CBC WITH DIFFERENTIAL/PLATELET
Absolute Lymphocytes: 1965 {cells}/uL (ref 850–3900)
Absolute Monocytes: 649 {cells}/uL (ref 200–950)
Basophils Absolute: 41 {cells}/uL (ref 0–200)
Basophils Relative: 0.7 %
Eosinophils Absolute: 59 {cells}/uL (ref 15–500)
Eosinophils Relative: 1 %
HCT: 41.9 % (ref 35.0–45.0)
Hemoglobin: 13.7 g/dL (ref 11.7–15.5)
MCH: 30.8 pg (ref 27.0–33.0)
MCHC: 32.7 g/dL (ref 32.0–36.0)
MCV: 94.2 fL (ref 80.0–100.0)
MPV: 11.1 fL (ref 7.5–12.5)
Monocytes Relative: 11 %
Neutro Abs: 3186 {cells}/uL (ref 1500–7800)
Neutrophils Relative %: 54 %
Platelets: 217 Thousand/uL (ref 140–400)
RBC: 4.45 Million/uL (ref 3.80–5.10)
RDW: 12.9 % (ref 11.0–15.0)
Total Lymphocyte: 33.3 %
WBC: 5.9 Thousand/uL (ref 3.8–10.8)

## 2023-09-23 LAB — COMPREHENSIVE METABOLIC PANEL WITH GFR
AG Ratio: 2.1 (calc) (ref 1.0–2.5)
ALT: 20 U/L (ref 6–29)
AST: 37 U/L — ABNORMAL HIGH (ref 10–30)
Albumin: 4.8 g/dL (ref 3.6–5.1)
Alkaline phosphatase (APISO): 57 U/L (ref 31–125)
BUN: 17 mg/dL (ref 7–25)
CO2: 25 mmol/L (ref 20–32)
Calcium: 8.9 mg/dL (ref 8.6–10.2)
Chloride: 105 mmol/L (ref 98–110)
Creat: 0.77 mg/dL (ref 0.50–0.97)
Globulin: 2.3 g/dL (ref 1.9–3.7)
Glucose, Bld: 101 mg/dL — ABNORMAL HIGH (ref 65–99)
Potassium: 4.2 mmol/L (ref 3.5–5.3)
Sodium: 139 mmol/L (ref 135–146)
Total Bilirubin: 0.6 mg/dL (ref 0.2–1.2)
Total Protein: 7.1 g/dL (ref 6.1–8.1)
eGFR: 103 mL/min/1.73m2 (ref >=60)

## 2024-09-24 ENCOUNTER — Encounter: Admitting: Family Medicine
